# Patient Record
Sex: Female | Born: 1980 | Race: Black or African American | Hispanic: No | Marital: Married | Smoking: Never smoker
Health system: Southern US, Community
[De-identification: ages and names within clinical notes are randomized; demographics above are authoritative.]

## PROBLEM LIST (undated history)

## (undated) DIAGNOSIS — Z21 Asymptomatic human immunodeficiency virus [HIV] infection status: Secondary | ICD-10-CM

## (undated) DIAGNOSIS — R011 Cardiac murmur, unspecified: Secondary | ICD-10-CM

## (undated) DIAGNOSIS — D649 Anemia, unspecified: Secondary | ICD-10-CM

## (undated) DIAGNOSIS — H5440 Blindness, one eye, unspecified eye: Secondary | ICD-10-CM

## (undated) DIAGNOSIS — I509 Heart failure, unspecified: Secondary | ICD-10-CM

## (undated) DIAGNOSIS — B20 Human immunodeficiency virus [HIV] disease: Secondary | ICD-10-CM

## (undated) HISTORY — DX: Asymptomatic human immunodeficiency virus (hiv) infection status: Z21

## (undated) HISTORY — DX: Human immunodeficiency virus (HIV) disease: B20

## (undated) HISTORY — DX: Blindness, one eye, unspecified eye: H54.40

---

## 2015-07-16 ENCOUNTER — Other Ambulatory Visit: Payer: Self-pay

## 2015-08-03 ENCOUNTER — Ambulatory Visit: Payer: Self-pay | Admitting: Infectious Diseases

## 2015-09-21 ENCOUNTER — Other Ambulatory Visit (INDEPENDENT_AMBULATORY_CARE_PROVIDER_SITE_OTHER): Payer: Medicaid Other

## 2015-09-21 DIAGNOSIS — B20 Human immunodeficiency virus [HIV] disease: Secondary | ICD-10-CM | POA: Diagnosis not present

## 2015-09-21 DIAGNOSIS — Z79899 Other long term (current) drug therapy: Secondary | ICD-10-CM

## 2015-09-21 DIAGNOSIS — Z113 Encounter for screening for infections with a predominantly sexual mode of transmission: Secondary | ICD-10-CM

## 2015-09-21 DIAGNOSIS — Z111 Encounter for screening for respiratory tuberculosis: Secondary | ICD-10-CM

## 2015-09-21 LAB — COMPLETE METABOLIC PANEL WITH GFR
ALK PHOS: 73 U/L (ref 33–115)
ALT: 3 U/L — ABNORMAL LOW (ref 6–29)
AST: 10 U/L (ref 10–30)
Albumin: 3.8 g/dL (ref 3.6–5.1)
BUN: 6 mg/dL — AB (ref 7–25)
CHLORIDE: 108 mmol/L (ref 98–110)
CO2: 25 mmol/L (ref 20–31)
Calcium: 8.7 mg/dL (ref 8.6–10.2)
Creat: 0.57 mg/dL (ref 0.50–1.10)
GFR, Est African American: >89 mL/min (ref >=60)
GLUCOSE: 85 mg/dL (ref 65–99)
POTASSIUM: 3.6 mmol/L (ref 3.5–5.3)
SODIUM: 139 mmol/L (ref 135–146)
Total Bilirubin: 2.7 mg/dL — ABNORMAL HIGH (ref 0.2–1.2)
Total Protein: 6.7 g/dL (ref 6.1–8.1)

## 2015-09-21 LAB — CBC WITH DIFFERENTIAL/PLATELET
BASOS ABS: 0 {cells}/uL (ref 0–200)
Basophils Relative: 0 %
EOS PCT: 3 %
Eosinophils Absolute: 135 cells/uL (ref 15–500)
HCT: 19.2 % — ABNORMAL LOW (ref 35.0–45.0)
Hemoglobin: 5.8 g/dL — CL (ref 11.7–15.5)
LYMPHS ABS: 2160 {cells}/uL (ref 850–3900)
Lymphocytes Relative: 48 %
MCH: 19.1 pg — AB (ref 27.0–33.0)
MCHC: 30.2 g/dL — AB (ref 32.0–36.0)
MCV: 63.4 fL — ABNORMAL LOW (ref 80.0–100.0)
MONOS PCT: 7 %
MPV: 9 fL (ref 7.5–12.5)
Monocytes Absolute: 315 cells/uL (ref 200–950)
Neutro Abs: 1890 cells/uL (ref 1500–7800)
Neutrophils Relative %: 42 %
PLATELETS: 582 10*3/uL — AB (ref 140–400)
RBC: 3.03 MIL/uL — AB (ref 3.80–5.10)
RDW: 22 % — ABNORMAL HIGH (ref 11.0–15.0)
WBC: 4.5 10*3/uL (ref 3.8–10.8)

## 2015-09-21 LAB — URINALYSIS
BILIRUBIN URINE: NEGATIVE
GLUCOSE, UA: NEGATIVE
Hgb urine dipstick: NEGATIVE
Ketones, ur: NEGATIVE
Leukocytes, UA: NEGATIVE
Nitrite: POSITIVE — AB
Protein, ur: NEGATIVE
SPECIFIC GRAVITY, URINE: 1.013 (ref 1.001–1.035)
pH: 6 (ref 5.0–8.0)

## 2015-09-21 LAB — LIPID PANEL
CHOL/HDL RATIO: 3.7 ratio (ref <=5.0)
Cholesterol: 151 mg/dL (ref 125–200)
HDL: 41 mg/dL — AB (ref >=46)
LDL CALC: 94 mg/dL (ref <130)
Triglycerides: 78 mg/dL (ref <150)
VLDL: 16 mg/dL (ref <30)

## 2015-09-21 NOTE — Addendum Note (Signed)
Addended by: Mariea Clonts D on: 09/21/2015 11:07 AM   Modules accepted: Orders

## 2015-09-22 ENCOUNTER — Telehealth: Payer: Self-pay | Admitting: *Deleted

## 2015-09-22 LAB — HEPATITIS B CORE ANTIBODY, TOTAL: HEP B C TOTAL AB: REACTIVE — AB

## 2015-09-22 LAB — HEPATITIS C ANTIBODY: HCV AB: NEGATIVE

## 2015-09-22 LAB — QUANTIFERON TB GOLD ASSAY (BLOOD)
Mitogen-Nil: 0.12 IU/mL
Quantiferon Nil Value: 0.02 IU/mL
Quantiferon Tb Ag Minus Nil Value: 0 IU/mL

## 2015-09-22 LAB — HEPATITIS B SURFACE ANTIBODY,QUALITATIVE: HEP B S AB: POSITIVE — AB

## 2015-09-22 LAB — URINE CYTOLOGY ANCILLARY ONLY
Chlamydia: NEGATIVE
Neisseria Gonorrhea: NEGATIVE

## 2015-09-22 LAB — RPR

## 2015-09-22 LAB — HEPATITIS A ANTIBODY, TOTAL: Hep A Total Ab: REACTIVE — AB

## 2015-09-22 LAB — HEPATITIS B SURFACE ANTIGEN: Hepatitis B Surface Ag: NEGATIVE

## 2015-09-22 NOTE — Telephone Encounter (Signed)
Dr. Orvan Falconer notified of low hemoglobin of 5.8 and I called to check on patient. No answer and no voice mail on her or her husbands phone. Called referral source, [facility] in [place] and spoke to Lindi Adie, Charity fundraiser. She is familiar with this patient and stated she has a history of iron deficiency and has had previous infusions in [place]. Patient is currently staying in a hotel in [place] and has follow up with Dr. Ninetta Lights on 10/02/15. Wendall Mola CMA

## 2015-09-23 LAB — HIV-1 RNA ULTRAQUANT REFLEX TO GENTYP+: HIV 1 RNA Quant: <20 copies/mL (ref <20)

## 2015-09-23 LAB — T-HELPER CELLS (CD4) COUNT (NOT AT ARMC)
CD4 % Helper T Cell: 32 % — ABNORMAL LOW (ref 33–55)
CD4 T Cell Abs: 710 /uL (ref 400–2700)

## 2015-09-29 ENCOUNTER — Other Ambulatory Visit: Payer: Self-pay | Admitting: Infectious Diseases

## 2015-09-29 DIAGNOSIS — D649 Anemia, unspecified: Secondary | ICD-10-CM

## 2015-09-30 ENCOUNTER — Other Ambulatory Visit: Payer: Medicaid Other

## 2015-10-02 ENCOUNTER — Telehealth: Payer: Self-pay | Admitting: *Deleted

## 2015-10-02 ENCOUNTER — Telehealth: Payer: Self-pay

## 2015-10-02 ENCOUNTER — Other Ambulatory Visit: Payer: Self-pay | Admitting: Hematology & Oncology

## 2015-10-02 ENCOUNTER — Encounter: Payer: Self-pay | Admitting: Infectious Diseases

## 2015-10-02 ENCOUNTER — Ambulatory Visit (INDEPENDENT_AMBULATORY_CARE_PROVIDER_SITE_OTHER): Payer: Medicaid Other | Admitting: Infectious Diseases

## 2015-10-02 VITALS — BP 121/71 | HR 81 | Temp 98.0°F | Ht 62.0 in | Wt 172.0 lb

## 2015-10-02 DIAGNOSIS — Z23 Encounter for immunization: Secondary | ICD-10-CM

## 2015-10-02 DIAGNOSIS — D509 Iron deficiency anemia, unspecified: Secondary | ICD-10-CM

## 2015-10-02 DIAGNOSIS — D573 Sickle-cell trait: Secondary | ICD-10-CM

## 2015-10-02 DIAGNOSIS — B20 Human immunodeficiency virus [HIV] disease: Secondary | ICD-10-CM | POA: Diagnosis not present

## 2015-10-02 DIAGNOSIS — R011 Cardiac murmur, unspecified: Secondary | ICD-10-CM | POA: Diagnosis not present

## 2015-10-02 LAB — CBC
HCT: 20.3 % — ABNORMAL LOW (ref 36.0–46.0)
Hemoglobin: 5.8 g/dL — CL (ref 12.0–15.0)
MCH: 19.1 pg — ABNORMAL LOW (ref 26.0–34.0)
MCHC: 28.6 g/dL — ABNORMAL LOW (ref 30.0–36.0)
MCV: 66.8 fL — AB (ref 78.0–100.0)
PLATELETS: 333 10*3/uL (ref 150–400)
RBC: 3.04 MIL/uL — ABNORMAL LOW (ref 3.87–5.11)
RDW: 20.3 % — AB (ref 11.5–15.5)
WBC: 6.1 10*3/uL (ref 4.0–10.5)

## 2015-10-02 MED ORDER — ELVITEG-COBIC-EMTRICIT-TENOFAF 150-150-200-10 MG PO TABS: 1.0000 | ORAL_TABLET | Freq: Every day | ORAL | 3 refills | Status: AC

## 2015-10-02 NOTE — Assessment & Plan Note (Addendum)
Will have her seen by Heme Will recheck her HgB, she is severely anemic, although asx. Normal BP and normal HR

## 2015-10-02 NOTE — Assessment & Plan Note (Signed)
She will be plugged into CM Will change her ART to qday genvoya Will see her back in  3 months.  F/u her shot record. Will need flu and pnvx She is Hep B and A immune.

## 2015-10-02 NOTE — Addendum Note (Signed)
Addended by: Wendall MolaOCKERHAM, JACQUELINE A on: 10/02/2015 11:43 AM   Modules accepted: Orders

## 2015-10-02 NOTE — Telephone Encounter (Signed)
Critical lab value call received - hemoglobin 5.8 Dr. Ninetta LightsHatcher notified. Per Dr Ninetta LightsHatcher, this is stable. Andree CossHowell, Cal Gindlesperger M, RN

## 2015-10-02 NOTE — Assessment & Plan Note (Signed)
Will check TTE

## 2015-10-02 NOTE — Addendum Note (Signed)
Addended by: Mariea ClontsGREEN, Ziasia Lenoir D on: 10/02/2015 11:27 AM   Modules accepted: Orders

## 2015-10-02 NOTE — Telephone Encounter (Signed)
Patient called back to clarify the message left on voicemail by Annice PihJackie, CMA. Called Annice PihJackie to clarify. Explained to patient ECHO is scheduled 10/08/15 at 1:00pm and she is to [facility] at 12:45pm [facility]. Patient verbalized directions and understanding. Rejeana Brockandace Murray, LPN

## 2015-10-02 NOTE — Telephone Encounter (Signed)
I spoke with heme They will see her next week.

## 2015-10-02 NOTE — Progress Notes (Signed)
   Subjective:    Patient ID: Robyn Contreras, female    DOB: Sep 24, 1980, [age] y.o.   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 States she gets regular iron infusions.  Was dx when she developed an illness.   Currently taking Atazanavir, norvir, truvada.  No prev HIV related hospitalizations.  She has 2 HIV- daughters.   HIV 1 RNA Quant (copies/mL)  Date Value  09/21/2015 <20   CD4 T Cell Abs (/uL)  Date Value  09/21/2015 710    The past medical history, family history and social history were reviewed/updated in EPIC  Review of Systems  Constitutional: Negative for appetite change and unexpected weight change.  Respiratory: Negative for cough and shortness of breath.   Gastrointestinal: Negative for constipation and diarrhea.  Genitourinary: Negative for difficulty urinating.  last PAP was "couple of months ago" in [place].      Objective:   Physical Exam  Constitutional: She appears well-developed and well-nourished.  HENT:  Mouth/Throat: No oropharyngeal exudate.  Neck: Neck supple.  Cardiovascular: Normal rate and regular rhythm.   Murmur heard. Pulmonary/Chest: Effort normal and breath sounds normal.  Abdominal: Soft. Bowel sounds are normal. There is no tenderness. There is no rebound.  Musculoskeletal: She exhibits no edema.  Lymphadenopathy:    She has no cervical adenopathy.  L eye occluded.       Assessment & Plan:

## 2015-10-05 ENCOUNTER — Ambulatory Visit: Payer: Self-pay | Admitting: Infectious Diseases

## 2015-10-05 ENCOUNTER — Encounter: Payer: Self-pay | Admitting: Hematology and Oncology

## 2015-10-05 ENCOUNTER — Telehealth: Payer: Self-pay | Admitting: Hematology and Oncology

## 2015-10-05 ENCOUNTER — Telehealth: Payer: Self-pay | Admitting: *Deleted

## 2015-10-05 NOTE — Telephone Encounter (Signed)
Called to inform patient that hematology would be contacting her to arrange the new patient appointment. Robyn Contreras

## 2015-10-05 NOTE — Telephone Encounter (Signed)
Appointment scheduled with Gudena on 8/29 at 1pm. Patient agreed. Letter mailed to the patient. Demographics verified.

## 2015-10-08 ENCOUNTER — Ambulatory Visit (HOSPITAL_COMMUNITY)
Admission: RE | Admit: 2015-10-08 | Discharge: 2015-10-08 | Disposition: A | Discharge status: Home / Self Care | Payer: Medicaid Other | Source: Ambulatory Visit | Attending: Infectious Diseases | Admitting: Infectious Diseases

## 2015-10-08 DIAGNOSIS — R011 Cardiac murmur, unspecified: Secondary | ICD-10-CM | POA: Diagnosis present

## 2015-10-08 DIAGNOSIS — I340 Nonrheumatic mitral (valve) insufficiency: Secondary | ICD-10-CM | POA: Insufficient documentation

## 2015-10-08 DIAGNOSIS — I517 Cardiomegaly: Secondary | ICD-10-CM | POA: Diagnosis not present

## 2015-10-08 DIAGNOSIS — I351 Nonrheumatic aortic (valve) insufficiency: Secondary | ICD-10-CM | POA: Insufficient documentation

## 2015-10-08 LAB — ECHOCARDIOGRAM COMPLETE BUBBLE STUDY
CHL CUP RV SYS PRESS: 29 mmHg
CHL CUP TV REG PEAK VELOCITY: 253 cm/s
E decel time: 215 msec
EERAT: 7.96
FS: 28 % (ref 28–44)
IVS/LV PW RATIO, ED: 0.77
LA diam end sys: 40 mm
LA vol A4C: 53.3 ml
LADIAMINDEX: 2.13 cm/m2
LASIZE: 40 mm
LAVOL: 64 mL
LAVOLIN: 34.1 mL/m2
LV E/e' medial: 7.96
LV TDI E'MEDIAL: 9.57
LV e' LATERAL: 11.7 cm/s
LVEEAVG: 7.96
LVOT area: 3.14 cm2
LVOTD: 20 mm
Lateral S' vel: 13.7 cm/s
MV Dec: 215
MV Peak grad: 3 mmHg
MVPKAVEL: 74.2 m/s
MVPKEVEL: 93.1 m/s
PW: 9.84 mm — AB (ref 0.6–1.1)
RV TAPSE: 23.9 mm
TDI e' lateral: 11.7
TRMAXVEL: 253 cm/s

## 2015-10-08 NOTE — Progress Notes (Signed)
Agitated saline bubble study x 2 administered during echo exam. Patient tolerated well.

## 2015-10-08 NOTE — Progress Notes (Signed)
  Echocardiogram 2D Echocardiogram has been performed.  Robyn Contreras, Robyn Contreras 10/08/2015, 2:42 PM

## 2015-10-12 ENCOUNTER — Telehealth: Payer: Self-pay

## 2015-10-12 NOTE — Telephone Encounter (Signed)
Patient called to get results of her ECHO. Explained to patient that LPN will send a note Dr. Ninetta LightsHatcher making him aware of her call. Once a response is given then she will get a return phone call. Patient verbalized understanding. Rejeana Brockandace Dazani Norby, LPN

## 2015-10-14 ENCOUNTER — Telehealth: Payer: Self-pay | Admitting: Infectious Diseases

## 2015-10-14 NOTE — Telephone Encounter (Signed)
Her heart function was normal on her echo. She has mild MR and AR Called and left message on her phone that her echo showed her heart function to be "essentially normal"

## 2015-10-20 ENCOUNTER — Telehealth: Payer: Self-pay | Admitting: Hematology and Oncology

## 2015-10-20 ENCOUNTER — Ambulatory Visit (HOSPITAL_BASED_OUTPATIENT_CLINIC_OR_DEPARTMENT_OTHER): Payer: Medicaid Other | Admitting: Hematology and Oncology

## 2015-10-20 ENCOUNTER — Encounter: Payer: Self-pay | Admitting: Hematology and Oncology

## 2015-10-20 VITALS — BP 124/66 | HR 82 | Temp 98.9°F | Resp 18 | Ht 62.0 in | Wt 176.7 lb

## 2015-10-20 DIAGNOSIS — D573 Sickle-cell trait: Secondary | ICD-10-CM | POA: Diagnosis not present

## 2015-10-20 DIAGNOSIS — D500 Iron deficiency anemia secondary to blood loss (chronic): Secondary | ICD-10-CM | POA: Insufficient documentation

## 2015-10-20 NOTE — Telephone Encounter (Signed)
Spoke with pt to confirm 9/5 infusion appt per 8/29 LOS

## 2015-10-20 NOTE — Assessment & Plan Note (Signed)
Chronic severe iron deficiency anemia secondary to heavy menstrual bleeding as well as recent tubal pregnancy related heavy bleeding. Patient has known sickle cell trait. Based on her prior history, patient can only tolerate IVs iron sucrose. We will order iron sucrose 200 mg doses 5 days. I will consult gastroenterology for upper endoscopy and colonoscopy.  Return to clinic in one month recheck on blood work and iron studies.

## 2015-10-20 NOTE — Progress Notes (Signed)
[facility] CONSULT NOTE  No care team member to display  CHIEF COMPLAINTS/PURPOSE OF CONSULTATION:  Severe iron deficiency anemia  HISTORY OF PRESENTING ILLNESS:  Robyn Contreras 35 y.o. female is here because of recent diagnosis of severe iron deficiency anemia. Patient has a known diagnosis of anemia all her life. She had previously been intolerant to oral iron therapy. She was in [place] where she had received IV iron treatments previously. Apparently she could only tolerate IV iron sucrose. She could not tolerate some other forms of iron. She gets episodes of shaking and dystonia related to periods of heavy menstrual bleeding most likely related to perform iron deficiency. Patient is short of breath to minimal exertion and does not seem to have much energy to do any activities. She had a daughter 2 years ago and accompanying that she had heavy menstrual bleeding. Currently her cycles are somewhat irregular and are generally very heavy as well.  I reviewed her records extensively and collaborated the history with the patient.  MEDICAL HISTORY:  Past Medical History:  Diagnosis Date  . Blindness of left eye   . HIV (human immunodeficiency virus infection) (HCC)     SURGICAL HISTORY: No past surgical history on file.  SOCIAL HISTORY: Social History   Social History  . Marital status: Married    Spouse name: N/A  . Number of children: N/A  . Years of education: N/A   Occupational History  . Not on file.   Social History Main Topics  . Smoking status: Never Smoker  . Smokeless tobacco: Never Used  . Alcohol use No  . Drug use: No  . Sexual activity: Yes    Partners: Male   Other Topics Concern  . Not on file   Social History Narrative  . No narrative on file    FAMILY HISTORY: Family History  Problem Relation Age of Onset  . Aneurysm Father     ALLERGIES:  is allergic to vancomycin.  MEDICATIONS:  Current Outpatient Prescriptions  Medication  Sig Dispense Refill  . elvitegravir-cobicistat-emtricitabine-tenofovir (GENVOYA) 150-150-200-10 MG TABS tablet Take 1 tablet by mouth daily with breakfast. 90 tablet 3   No current facility-administered medications for this visit.     REVIEW OF SYSTEMS:   Constitutional: Denies fevers, chills or abnormal night sweats Eyes: Denies blurriness of vision, double vision or watery eyes Ears, nose, mouth, throat, and face: Denies mucositis or sore throat Respiratory: Denies cough, dyspnea or wheezes Cardiovascular: Denies palpitation, chest discomfort or lower extremity swelling Gastrointestinal:  Denies nausea, heartburn or change in bowel habits Skin: Denies abnormal skin rashes Lymphatics: Denies new lymphadenopathy or easy bruising Neurological:Denies numbness, tingling or new weaknesses Behavioral/Psych: Mood is stable, no new changes   All other systems were reviewed with the patient and are negative.  PHYSICAL EXAMINATION: ECOG PERFORMANCE STATUS: 1 - Symptomatic but completely ambulatory  Vitals:   10/20/15 1355  BP: 124/66  Pulse: 82  Resp: 18  Temp: 98.9 F (37.2 C)   Filed Weights   10/20/15 1355  Weight: 176 lb 11.2 oz (80.2 kg)    GENERAL:alert, no distress and comfortable SKIN: skin color, texture, turgor are normal, no rashes or significant lesions EYES: normal, conjunctiva are pink and non-injected, sclera clear OROPHARYNX:no exudate, no erythema and lips, buccal mucosa, and tongue normal  NECK: supple, thyroid normal size, non-tender, without nodularity LYMPH:  no palpable lymphadenopathy in the cervical, axillary or inguinal LUNGS: clear to auscultation and percussion with normal breathing effort HEART:  Ejection systolic murmur ABDOMEN:abdomen soft, non-tender and normal bowel sounds Musculoskeletal:no cyanosis of digits and no clubbing  PSYCH: alert & oriented x 3 with fluent speech NEURO: no focal motor/sensory deficits  LABORATORY DATA:  I have reviewed  the data as listed Lab Results  Component Value Date   WBC 6.1 10/02/2015   HGB 5.8 (LL) 10/02/2015   HCT 20.3 (L) 10/02/2015   MCV 66.8 (L) 10/02/2015   PLT 333 10/02/2015   Lab Results  Component Value Date   NA 139 09/21/2015   K 3.6 09/21/2015   CL 108 09/21/2015   CO2 25 09/21/2015   ASSESSMENT AND PLAN:  Iron deficiency anemia due to chronic blood loss Chronic severe iron deficiency anemia secondary to heavy menstrual bleeding as well as recent tubal pregnancy related heavy bleeding. Patient has known sickle cell trait. Based on her prior history, patient can only tolerate IVs iron sucrose. We will order iron sucrose 200 mg doses 5 days. I will consult gastroenterology for upper endoscopy and colonoscopy.  Return to clinic in one month recheck on blood work and iron studies.   All questions were answered. The patient knows to call the clinic with any problems, questions or concerns.    Sabas Sous, MD 10/20/15

## 2015-10-20 NOTE — Telephone Encounter (Signed)
appt made and avs printed °

## 2015-10-27 ENCOUNTER — Other Ambulatory Visit: Payer: Self-pay | Admitting: Hematology and Oncology

## 2015-10-27 ENCOUNTER — Ambulatory Visit (HOSPITAL_BASED_OUTPATIENT_CLINIC_OR_DEPARTMENT_OTHER): Payer: Medicaid Other

## 2015-10-27 ENCOUNTER — Encounter: Payer: Self-pay | Admitting: Licensed Clinical Social Worker

## 2015-10-27 VITALS — BP 108/77 | HR 80 | Temp 98.1°F | Resp 16

## 2015-10-27 DIAGNOSIS — D500 Iron deficiency anemia secondary to blood loss (chronic): Secondary | ICD-10-CM

## 2015-10-27 DIAGNOSIS — N921 Excessive and frequent menstruation with irregular cycle: Secondary | ICD-10-CM

## 2015-10-27 MED ORDER — IRON SUCROSE 20 MG/ML IV SOLN
200.0000 mg | Freq: Once | INTRAVENOUS | Status: AC
  Administered 2015-10-27: 200 mg via INTRAVENOUS
  Filled 2015-10-27: qty 5

## 2015-10-27 MED ORDER — SODIUM CHLORIDE 0.9 % IV SOLN
Freq: Once | INTRAVENOUS | Status: AC
  Administered 2015-10-27: 08:00:00 via INTRAVENOUS

## 2015-10-27 NOTE — Patient Instructions (Signed)
Iron Sucrose injection  What is this medicine?  IRON SUCROSE (AHY ern SOO krohs) is an iron complex. Iron is used to make healthy red blood cells, which carry oxygen and nutrients throughout the body. This medicine is used to treat iron deficiency anemia in people with chronic kidney disease.  This medicine may be used for other purposes; ask your health care provider or pharmacist if you have questions.  What should I tell my health care provider before I take this medicine?  They need to know if you have any of these conditions:  -anemia not caused by low iron levels  -heart disease  -high levels of iron in the blood  -kidney disease  -liver disease  -an unusual or allergic reaction to iron, other medicines, foods, dyes, or preservatives  -pregnant or trying to get pregnant  -breast-feeding  How should I use this medicine?  This medicine is for infusion into a vein. It is given by a health care professional in a hospital or clinic setting.  Talk to your pediatrician regarding the use of this medicine in children. While this drug may be prescribed for children as young as 2 years for selected conditions, precautions do apply.  Overdosage: If you think you have taken too much of this medicine contact a poison control center or emergency room at once.  NOTE: This medicine is only for you. Do not share this medicine with others.  What if I miss a dose?  It is important not to miss your dose. Call your doctor or health care professional if you are unable to keep an appointment.  What may interact with this medicine?  Do not take this medicine with any of the following medications:  -deferoxamine  -dimercaprol  -other iron products  This medicine may also interact with the following medications:  -chloramphenicol  -deferasirox  This list may not describe all possible interactions. Give your health care provider a list of all the medicines, herbs, non-prescription drugs, or dietary supplements you use. Also tell them if  you smoke, drink alcohol, or use illegal drugs. Some items may interact with your medicine.  What should I watch for while using this medicine?  Visit your doctor or healthcare professional regularly. Tell your doctor or healthcare professional if your symptoms do not start to get better or if they get worse. You may need blood work done while you are taking this medicine.  You may need to follow a special diet. Talk to your doctor. Foods that contain iron include: whole grains/cereals, dried fruits, beans, or peas, leafy green vegetables, and organ meats (liver, kidney).  What side effects may I notice from receiving this medicine?  Side effects that you should report to your doctor or health care professional as soon as possible:  -allergic reactions like skin rash, itching or hives, swelling of the face, lips, or tongue  -breathing problems  -changes in blood pressure  -cough  -fast, irregular heartbeat  -feeling faint or lightheaded, falls  -fever or chills  -flushing, sweating, or hot feelings  -joint or muscle aches/pains  -seizures  -swelling of the ankles or feet  -unusually weak or tired  Side effects that usually do not require medical attention (report to your doctor or health care professional if they continue or are bothersome):  -diarrhea  -feeling achy  -headache  -irritation at site where injected  -nausea, vomiting  -stomach upset  -tiredness  This list may not describe all possible side effects. Call your doctor   for medical advice about side effects. You may report side effects to FDA at 1-800-FDA-1088.  Where should I keep my medicine?  This drug is given in a hospital or clinic and will not be stored at home.  NOTE: This sheet is a summary. It may not cover all possible information. If you have questions about this medicine, talk to your doctor, pharmacist, or health care provider.      2016, Elsevier/Gold Standard. (2010-11-18 17:14:35)

## 2015-10-28 ENCOUNTER — Ambulatory Visit: Payer: Medicaid Other

## 2015-10-29 ENCOUNTER — Encounter: Payer: Self-pay | Admitting: Hematology and Oncology

## 2015-10-29 ENCOUNTER — Ambulatory Visit (HOSPITAL_BASED_OUTPATIENT_CLINIC_OR_DEPARTMENT_OTHER): Payer: Medicaid Other

## 2015-10-29 VITALS — BP 109/79 | HR 59 | Temp 98.3°F | Resp 18

## 2015-10-29 DIAGNOSIS — D500 Iron deficiency anemia secondary to blood loss (chronic): Secondary | ICD-10-CM

## 2015-10-29 DIAGNOSIS — N920 Excessive and frequent menstruation with regular cycle: Secondary | ICD-10-CM

## 2015-10-29 MED ORDER — SODIUM CHLORIDE 0.9 % IV SOLN
200.0000 mg | Freq: Once | INTRAVENOUS | Status: AC
  Administered 2015-10-29: 200 mg via INTRAVENOUS
  Filled 2015-10-29: qty 5

## 2015-10-29 MED ORDER — SODIUM CHLORIDE 0.9 % IV SOLN
Freq: Once | INTRAVENOUS | Status: AC
  Administered 2015-10-29: 15:00:00 via INTRAVENOUS

## 2015-10-29 NOTE — Patient Instructions (Signed)
Iron Sucrose injection  What is this medicine?  IRON SUCROSE (AHY ern SOO krohs) is an iron complex. Iron is used to make healthy red blood cells, which carry oxygen and nutrients throughout the body. This medicine is used to treat iron deficiency anemia in people with chronic kidney disease.  This medicine may be used for other purposes; ask your health care provider or pharmacist if you have questions.  What should I tell my health care provider before I take this medicine?  They need to know if you have any of these conditions:  -anemia not caused by low iron levels  -heart disease  -high levels of iron in the blood  -kidney disease  -liver disease  -an unusual or allergic reaction to iron, other medicines, foods, dyes, or preservatives  -pregnant or trying to get pregnant  -breast-feeding  How should I use this medicine?  This medicine is for infusion into a vein. It is given by a health care professional in a hospital or clinic setting.  Talk to your pediatrician regarding the use of this medicine in children. While this drug may be prescribed for children as young as 2 years for selected conditions, precautions do apply.  Overdosage: If you think you have taken too much of this medicine contact a poison control center or emergency room at once.  NOTE: This medicine is only for you. Do not share this medicine with others.  What if I miss a dose?  It is important not to miss your dose. Call your doctor or health care professional if you are unable to keep an appointment.  What may interact with this medicine?  Do not take this medicine with any of the following medications:  -deferoxamine  -dimercaprol  -other iron products  This medicine may also interact with the following medications:  -chloramphenicol  -deferasirox  This list may not describe all possible interactions. Give your health care provider a list of all the medicines, herbs, non-prescription drugs, or dietary supplements you use. Also tell them if  you smoke, drink alcohol, or use illegal drugs. Some items may interact with your medicine.  What should I watch for while using this medicine?  Visit your doctor or healthcare professional regularly. Tell your doctor or healthcare professional if your symptoms do not start to get better or if they get worse. You may need blood work done while you are taking this medicine.  You may need to follow a special diet. Talk to your doctor. Foods that contain iron include: whole grains/cereals, dried fruits, beans, or peas, leafy green vegetables, and organ meats (liver, kidney).  What side effects may I notice from receiving this medicine?  Side effects that you should report to your doctor or health care professional as soon as possible:  -allergic reactions like skin rash, itching or hives, swelling of the face, lips, or tongue  -breathing problems  -changes in blood pressure  -cough  -fast, irregular heartbeat  -feeling faint or lightheaded, falls  -fever or chills  -flushing, sweating, or hot feelings  -joint or muscle aches/pains  -seizures  -swelling of the ankles or feet  -unusually weak or tired  Side effects that usually do not require medical attention (report to your doctor or health care professional if they continue or are bothersome):  -diarrhea  -feeling achy  -headache  -irritation at site where injected  -nausea, vomiting  -stomach upset  -tiredness  This list may not describe all possible side effects. Call your doctor   for medical advice about side effects. You may report side effects to FDA at 1-800-FDA-1088.  Where should I keep my medicine?  This drug is given in a hospital or clinic and will not be stored at home.  NOTE: This sheet is a summary. It may not cover all possible information. If you have questions about this medicine, talk to your doctor, pharmacist, or health care provider.      2016, Elsevier/Gold Standard. (2010-11-18 17:14:35)

## 2015-10-30 ENCOUNTER — Ambulatory Visit (HOSPITAL_BASED_OUTPATIENT_CLINIC_OR_DEPARTMENT_OTHER): Payer: Medicaid Other

## 2015-10-30 VITALS — BP 117/78 | HR 68 | Temp 99.1°F | Resp 16

## 2015-10-30 DIAGNOSIS — D500 Iron deficiency anemia secondary to blood loss (chronic): Secondary | ICD-10-CM

## 2015-10-30 DIAGNOSIS — N920 Excessive and frequent menstruation with regular cycle: Secondary | ICD-10-CM | POA: Diagnosis not present

## 2015-10-30 MED ORDER — SODIUM CHLORIDE 0.9 % IV SOLN
200.0000 mg | Freq: Once | INTRAVENOUS | Status: AC
  Administered 2015-10-30: 200 mg via INTRAVENOUS
  Filled 2015-10-30: qty 10

## 2015-10-30 NOTE — Patient Instructions (Signed)
Iron Sucrose injection  What is this medicine?  IRON SUCROSE (AHY ern SOO krohs) is an iron complex. Iron is used to make healthy red blood cells, which carry oxygen and nutrients throughout the body. This medicine is used to treat iron deficiency anemia in people with chronic kidney disease.  This medicine may be used for other purposes; ask your health care provider or pharmacist if you have questions.  What should I tell my health care provider before I take this medicine?  They need to know if you have any of these conditions:  -anemia not caused by low iron levels  -heart disease  -high levels of iron in the blood  -kidney disease  -liver disease  -an unusual or allergic reaction to iron, other medicines, foods, dyes, or preservatives  -pregnant or trying to get pregnant  -breast-feeding  How should I use this medicine?  This medicine is for infusion into a vein. It is given by a health care professional in a hospital or clinic setting.  Talk to your pediatrician regarding the use of this medicine in children. While this drug may be prescribed for children as Robyn Contreras as 2 years for selected conditions, precautions do apply.  Overdosage: If you think you have taken too much of this medicine contact a poison control center or emergency room at once.  NOTE: This medicine is only for you. Do not share this medicine with others.  What if I miss a dose?  It is important not to miss your dose. Call your doctor or health care professional if you are unable to keep an appointment.  What may interact with this medicine?  Do not take this medicine with any of the following medications:  -deferoxamine  -dimercaprol  -other iron products  This medicine may also interact with the following medications:  -chloramphenicol  -deferasirox  This list may not describe all possible interactions. Give your health care provider a list of all the medicines, herbs, non-prescription drugs, or dietary supplements you use. Also tell them if  you smoke, drink alcohol, or use illegal drugs. Some items may interact with your medicine.  What should I watch for while using this medicine?  Visit your doctor or healthcare professional regularly. Tell your doctor or healthcare professional if your symptoms do not start to get better or if they get worse. You may need blood work done while you are taking this medicine.  You may need to follow a special diet. Talk to your doctor. Foods that contain iron include: whole grains/cereals, dried fruits, beans, or peas, leafy green vegetables, and organ meats (liver, kidney).  What side effects may I notice from receiving this medicine?  Side effects that you should report to your doctor or health care professional as soon as possible:  -allergic reactions like skin rash, itching or hives, swelling of the face, lips, or tongue  -breathing problems  -changes in blood pressure  -cough  -fast, irregular heartbeat  -feeling faint or lightheaded, falls  -fever or chills  -flushing, sweating, or hot feelings  -joint or muscle aches/pains  -seizures  -swelling of the ankles or feet  -unusually weak or tired  Side effects that usually do not require medical attention (report to your doctor or health care professional if they continue or are bothersome):  -diarrhea  -feeling achy  -headache  -irritation at site where injected  -nausea, vomiting  -stomach upset  -tiredness  This list may not describe all possible side effects. Call your doctor   for medical advice about side effects. You may report side effects to FDA at 1-800-FDA-1088.  Where should I keep my medicine?  This drug is given in a hospital or clinic and will not be stored at home.  NOTE: This sheet is a summary. It may not cover all possible information. If you have questions about this medicine, talk to your doctor, pharmacist, or health care provider.      2016, Elsevier/Gold Standard. (2010-11-18 17:14:35)

## 2015-11-02 ENCOUNTER — Telehealth: Payer: Self-pay | Admitting: Hematology and Oncology

## 2015-11-02 ENCOUNTER — Ambulatory Visit: Payer: Medicaid Other

## 2015-11-02 NOTE — Telephone Encounter (Signed)
Spoke with patient's spouse on 10/30/15 re delays/wait time when patient comes in for infusion. Spouse expressed concern/frustration re the wait times and the amount of time for infusion as it is affecting his work schedule when they are here beyond the amount of time they planned for. I apologize for the inconvenience and asked if there was a time of day that would work out better. Per husband he was unsure if patient could make the PM appointment on Monday and would need AM.   Patient was rescheduled for 9/12 in the AM and a message was left for both patient and spouse.  Patient returned call this morning and moved time back to PM due to spouse's work schedule has changed. Patient also wanted to r/s the infusion she missed from 9/6.  Gave patient new appointments for 9/12 @ 3 pm and 9/13 @ 2:45 pm. Rescheduled 9/11 to 9/12 and 9/6 (missed) to 9/13. Worked with inf to schedule appointments.  Both infusion Administrator, sportsmanager and practice administrator have been informed.

## 2015-11-03 ENCOUNTER — Other Ambulatory Visit: Payer: Self-pay | Admitting: Hematology and Oncology

## 2015-11-03 ENCOUNTER — Ambulatory Visit: Payer: Medicaid Other

## 2015-11-04 ENCOUNTER — Ambulatory Visit: Payer: Medicaid Other

## 2015-11-05 ENCOUNTER — Telehealth: Payer: Self-pay | Admitting: *Deleted

## 2015-11-05 NOTE — Telephone Encounter (Signed)
Per staff message I have moved appts from this week to next week. Scheduler notified

## 2015-11-12 ENCOUNTER — Ambulatory Visit: Payer: Medicaid Other

## 2015-11-13 ENCOUNTER — Ambulatory Visit: Payer: Medicaid Other

## 2015-11-20 ENCOUNTER — Other Ambulatory Visit: Payer: Medicaid Other

## 2015-11-20 ENCOUNTER — Ambulatory Visit: Payer: Medicaid Other | Admitting: Hematology and Oncology

## 2015-11-20 NOTE — Assessment & Plan Note (Deleted)
Chronic severe iron deficiency anemia secondary to heavy menstrual bleeding as well as recent tubal pregnancy related heavy bleeding. Patient has known sickle cell trait. Based on her prior history, patient can only tolerate IVs iron sucrose. We consulted gastroenterology for upper endoscopy and colonoscopy.

## 2015-11-26 ENCOUNTER — Encounter (HOSPITAL_COMMUNITY): Payer: Self-pay

## 2015-11-26 ENCOUNTER — Emergency Department (HOSPITAL_COMMUNITY)
Admission: EM | Admit: 2015-11-26 | Discharge: 2015-11-26 | Disposition: A | Discharge status: Home / Self Care | Payer: Medicaid Other | Attending: Emergency Medicine | Admitting: Emergency Medicine

## 2015-11-26 DIAGNOSIS — Z79899 Other long term (current) drug therapy: Secondary | ICD-10-CM | POA: Insufficient documentation

## 2015-11-26 DIAGNOSIS — K047 Periapical abscess without sinus: Secondary | ICD-10-CM | POA: Insufficient documentation

## 2015-11-26 DIAGNOSIS — R220 Localized swelling, mass and lump, head: Secondary | ICD-10-CM | POA: Diagnosis present

## 2015-11-26 MED ORDER — CLINDAMYCIN HCL 300 MG PO CAPS: 300.0000 mg | ORAL_CAPSULE | Freq: Four times a day (QID) | ORAL | 0 refills | Status: DC

## 2015-11-26 NOTE — Discharge Instructions (Signed)
Use heat on the sore area 3-4 times a day.  Rinse the mouth with warm salt water 3-4 times a day.

## 2015-11-26 NOTE — ED Provider Notes (Signed)
[facility]-EMERGENCY DEPT Provider Note   CSN: [id number] Arrival date & time: 11/26/15  1946  By signing my name below, I, Robyn Contreras, attest that this documentation has been prepared under the direction and in the presence of Mancel BaleElliott Flordia Kassem, MD . Electronically Signed: Christy SartoriusAnastasia Contreras, Scribe. 11/26/2015. 9:32 PM.  History   Chief Complaint Chief Complaint  Patient presents with  . Facial Swelling   The history is provided by the patient and medical records. No language interpreter was used.    HPI Comments:  Robyn Contreras is a 35 y.o. female who presents to the Emergency Department complaining of left sided facial swelling and pain.  She denies dental pain.  Pt is currently taking Genvoya.  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   She is not currently working.  No alleviating factors noted.  No additional injury or complaint.    Past Medical History:  Diagnosis Date  . Blindness of left eye   . HIV (human immunodeficiency virus infection) [facility](HCC)     Patient Active Problem List   Diagnosis Date Noted  . Heavy menstrual bleeding 10/29/2015  . Iron deficiency anemia due to chronic blood loss 10/20/2015  . HIV disease (HCC) 10/02/2015  . Sickle cell trait (HCC) 10/02/2015  . Heart murmur 10/02/2015    History reviewed. No pertinent surgical history.  OB History    No data available       Home Medications    Prior to Admission medications   Medication Sig Start Date End Date Taking? Authorizing Provider  clindamycin (CLEOCIN) 300 MG capsule Take 1 capsule (300 mg total) by mouth 4 (four) times daily. X 7 days 11/26/15   Mancel BaleElliott Vinisha Faxon, MD  elvitegravir-cobicistat-emtricitabine-tenofovir (GENVOYA) 150-150-200-10 MG TABS tablet Take 1 tablet by mouth daily with breakfast. 10/02/15   Ginnie SmartJeffrey C Hatcher, MD    Family History Family History  Problem Relation Age of Onset  . Aneurysm Father     Social History Social  History  Substance Use Topics  . Smoking status: Never Smoker  . Smokeless tobacco: Never Used  . Alcohol use No     Allergies   Vancomycin   Review of Systems Review of Systems  Constitutional: Negative for fever.  HENT: Positive for facial swelling. Negative for dental problem.   All other systems reviewed and are negative.    Physical Exam Updated Vital Signs BP 125/87 (BP Location: Left Arm)   Pulse 65   Temp 98.4 F (36.9 C) (Oral)   Resp 19   LMP 11/13/2015   SpO2 99%   Physical Exam  Constitutional: She is oriented to person, place, and time. She appears well-developed and well-nourished. No distress.  HENT:  Head: Normocephalic and atraumatic.  No trismus.  Palpable abscess in the mandibular region.    Eyes: Conjunctivae are normal.  Cardiovascular: Normal rate.   Pulmonary/Chest: Effort normal.  Neurological: She is alert and oriented to person, place, and time.  Skin: Skin is warm and dry.  Psychiatric: She has a normal mood and affect.  Nursing note and vitals reviewed.    ED Treatments / Results   DIAGNOSTIC STUDIES:  Oxygen Saturation is 99% on RA, NML by my interpretation.    COORDINATION OF CARE:  9:30 PM Discussed treatment plan with pt at bedside and pt agreed to plan.  Labs (all labs ordered are listed, but only abnormal results are displayed) Labs Reviewed - No data to display  EKG  EKG Interpretation None       Radiology No results found.  Procedures Procedures (including critical care time)  Medications Ordered in ED Medications - No data to display   Initial Impression / Assessment and Plan / ED Course  I have reviewed the triage vital signs and the nursing notes.  Pertinent labs & imaging results that were available during my care of the patient were reviewed by me and considered in my medical decision making (see chart for details).  Clinical Course    Medications - No data to display  No data found.   At  discharge- Reevaluation with update and discussion. After initial assessment and treatment, an updated evaluation reveals no change in clinical status, findings discussed with patient and all questions answered. Robyn Contreras L    Final Clinical Impressions(s) / ED Diagnoses   Final diagnoses:  Dental abscess   Dental abscess, without trismus or suspected deep tissue infection or airway compromise. No indication for urgent drainage at this time.  Nursing Notes Reviewed/ Care Coordinated Applicable Imaging Reviewed Interpretation of Laboratory Data incorporated into ED treatment  The patient appears reasonably screened and/or stabilized for discharge and I doubt any other medical condition or other [facility] requiring further screening, evaluation, or treatment in the ED at this time prior to discharge.  Plan: Home Medications- continue; Home Treatments- rest, heat; return here if the recommended treatment, does not improve the symptoms; Recommended follow up- PCP prn. Dentist asap.   New Prescriptions New Prescriptions   CLINDAMYCIN (CLEOCIN) 300 MG CAPSULE    Take 1 capsule (300 mg total) by mouth 4 (four) times daily. X 7 days   I personally performed the services described in this documentation, which was scribed in my presence. The recorded information has been reviewed and is accurate.       Mancel Bale, MD 11/30/15 1416

## 2015-11-26 NOTE — ED Notes (Signed)
Patient is alert and oriented x3.  She was given DC instructions and follow up visit instructions.  Patient gave verbal understanding. She was DC ambulatory under her own power to home.  V/S stable.  He was not showing any signs of distress on DC 

## 2015-11-26 NOTE — Progress Notes (Signed)
Patient listed as having Medicaid insurance without a pcp.  Pcp listed on patient's insurance card is located at [facility].

## 2015-11-26 NOTE — ED Triage Notes (Signed)
Pt complains of facial swelling since last night and worse this am on the left side, she's had an abcess on that side before

## 2015-12-16 ENCOUNTER — Telehealth: Payer: Self-pay | Admitting: Hematology and Oncology

## 2015-12-16 NOTE — Telephone Encounter (Signed)
Returned call to patient to reschedule 9/29 Appointments. Rescheduled to 10/31. Call completed.

## 2015-12-17 ENCOUNTER — Ambulatory Visit: Payer: Medicaid Other

## 2015-12-22 ENCOUNTER — Other Ambulatory Visit: Payer: Medicaid Other

## 2015-12-22 ENCOUNTER — Ambulatory Visit: Payer: Medicaid Other | Admitting: Hematology and Oncology

## 2015-12-22 NOTE — Assessment & Plan Note (Deleted)
Chronic severe iron deficiency anemia secondary to heavy menstrual bleeding as well as recent tubal pregnancy related heavy bleeding. Patient has known sickle cell trait. Based on her prior history, patient can only tolerate IVs iron sucrose.  Treatment summary: IV iron sucrose given on 10/27/15, 10/29/15 and 10/30/15 patient did not come for the remaining 2 appointments for IV iron  Gastroenterology has been consulted for upper endoscopy and colonoscopy. Blood work done today revealed  Return to clinic in 3 months for lap count check and follow-up.

## 2016-01-12 ENCOUNTER — Ambulatory Visit: Payer: Medicaid Other

## 2016-01-21 ENCOUNTER — Telehealth: Payer: Self-pay

## 2016-01-21 NOTE — Telephone Encounter (Signed)
Called pt back regarding vm left about rescheduling her appt. For lab dr and iron infusions. Pt states that she had a lot going on in her life and was not able to show up for prior appts. Told pt that will send a scheduling msg to have her labs done next week and will need to come back for md and infusion another day next week. Pt voiced understanding and will look forward to hearing from scheduling for upcoming appt for next week.

## 2016-01-22 ENCOUNTER — Telehealth: Payer: Self-pay | Admitting: Hematology and Oncology

## 2016-01-22 NOTE — Telephone Encounter (Signed)
sw pt to confirm 12/4 and 12/6 appt dates/timesper LOS

## 2016-01-25 ENCOUNTER — Other Ambulatory Visit (HOSPITAL_BASED_OUTPATIENT_CLINIC_OR_DEPARTMENT_OTHER): Payer: Medicaid Other

## 2016-01-25 DIAGNOSIS — D500 Iron deficiency anemia secondary to blood loss (chronic): Secondary | ICD-10-CM

## 2016-01-25 LAB — CBC WITH DIFFERENTIAL/PLATELET
BASO%: 0.6 % (ref 0.0–2.0)
BASOS ABS: 0 10*3/uL (ref 0.0–0.1)
EOS ABS: 0.1 10*3/uL (ref 0.0–0.5)
EOS%: 2.5 % (ref 0.0–7.0)
HEMATOCRIT: 28 % — AB (ref 34.8–46.6)
HEMOGLOBIN: 8.7 g/dL — AB (ref 11.6–15.9)
LYMPH#: 2.4 10*3/uL (ref 0.9–3.3)
LYMPH%: 50.1 % — ABNORMAL HIGH (ref 14.0–49.7)
MCH: 23.7 pg — AB (ref 25.1–34.0)
MCHC: 31.1 g/dL — ABNORMAL LOW (ref 31.5–36.0)
MCV: 76.3 fL — AB (ref 79.5–101.0)
MONO#: 0.3 10*3/uL (ref 0.1–0.9)
MONO%: 7 % (ref 0.0–14.0)
NEUT#: 1.9 10*3/uL (ref 1.5–6.5)
NEUT%: 39.8 % (ref 38.4–76.8)
PLATELETS: 298 10*3/uL (ref 145–400)
RBC: 3.67 10*6/uL — ABNORMAL LOW (ref 3.70–5.45)
RDW: 19.4 % — AB (ref 11.2–14.5)
WBC: 4.9 10*3/uL (ref 3.9–10.3)

## 2016-01-25 LAB — IRON AND TIBC
%SAT: 5 % — AB (ref 21–57)
IRON: 24 ug/dL — AB (ref 41–142)
TIBC: 443 ug/dL (ref 236–444)
UIBC: 419 ug/dL — ABNORMAL HIGH (ref 120–384)

## 2016-01-25 LAB — FERRITIN: Ferritin: 5 ng/ml — ABNORMAL LOW (ref 9–269)

## 2016-01-26 NOTE — Assessment & Plan Note (Deleted)
Chronic severe iron deficiency anemia secondary to heavy menstrual bleeding as well as recent tubal pregnancy related heavy bleeding. Patient has known sickle cell trait.  Lab Review: 01/25/16:  Hb 8.7 (imp from 5.8) MCV 76.3 (was 66.8) %sat: 5% TIBC: 419 Ferritin: 5  Based on her prior history, patient can only tolerate IVs iron sucrose. We will order iron sucrose 200 mg doses 5 days.

## 2016-01-27 ENCOUNTER — Ambulatory Visit: Payer: Medicaid Other

## 2016-01-27 ENCOUNTER — Telehealth: Payer: Self-pay | Admitting: Medical Oncology

## 2016-01-27 ENCOUNTER — Ambulatory Visit: Payer: Medicaid Other | Admitting: Hematology and Oncology

## 2016-01-27 LAB — HEMOGLOBINOPATHY EVALUATION
HGB C: 0 %
HGB S: 30.1 % — ABNORMAL HIGH
Hemoglobin A2 Quantitation: 3.7 % — ABNORMAL HIGH (ref 0.7–3.1)
Hemoglobin F Quantitation: 0 % (ref 0.0–2.0)
Hgb A: 66.2 % — ABNORMAL LOW (ref 94.0–98.0)
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
## 2016-01-27 NOTE — Telephone Encounter (Signed)
Spoke to pt thank you

## 2016-01-27 NOTE — Progress Notes (Deleted)
No care team member to display  DIAGNOSIS:  Encounter Diagnosis  Name Primary?  . Iron deficiency anemia due to chronic blood loss     SUMMARY OF ONCOLOGIC HISTORY: IV iron sucrose given 3/5 days September 2017  CHIEF COMPLIANT: Follow-up of iron deficiency anemia  INTERVAL HISTORY: Robyn Contreras is a 35 year old with blood loss related recurrent iron deficiency anemia. She only received 3 doses of IV iron in September. She did not come for the other appointments because of the inconvenience of coming down for the treatment as well as some issues regarding appointment time and place. She had previous heavy menstrual bleeding which was causing her anemia. Her hemoglobin was previously in the 5 g range. She has improved with IV iron infusion. Her symptoms have also improved. She is still extremely anemic and iron deficient still.  REVIEW OF SYSTEMS:   Constitutional: Denies fevers, chills or abnormal weight loss Eyes: Denies blurriness of vision Ears, nose, mouth, throat, and face: Denies mucositis or sore throat Respiratory: Denies cough, dyspnea or wheezes Cardiovascular: Denies palpitation, chest discomfort Gastrointestinal:  Denies nausea, heartburn or change in bowel habits Skin: Denies abnormal skin rashes Lymphatics: Denies new lymphadenopathy or easy bruising Neurological:Denies numbness, tingling or new weaknesses Behavioral/Psych: Mood is stable, no new changes  Extremities: No lower extremity edema  All other systems were reviewed with the patient and are negative.  I have reviewed the past medical history, past surgical history, social history and family history with the patient and they are unchanged from previous note.  ALLERGIES:  is allergic to vancomycin.  MEDICATIONS:  Current Outpatient Prescriptions  Medication Sig Dispense Refill  . clindamycin (CLEOCIN) 300 MG capsule Take 1 capsule (300 mg total) by mouth 4 (four) times daily. X 7 days 28 capsule 0  .  elvitegravir-cobicistat-emtricitabine-tenofovir (GENVOYA) 150-150-200-10 MG TABS tablet Take 1 tablet by mouth daily with breakfast. 90 tablet 3   No current facility-administered medications for this visit.     PHYSICAL EXAMINATION: ECOG PERFORMANCE STATUS: 1 - Symptomatic but completely ambulatory  There were no vitals filed for this visit. There were no vitals filed for this visit.  GENERAL:alert, no distress and comfortable SKIN: skin color, texture, turgor are normal, no rashes or significant lesions EYES: normal, Conjunctiva are pink and non-injected, sclera clear OROPHARYNX:no exudate, no erythema and lips, buccal mucosa, and tongue normal  NECK: supple, thyroid normal size, non-tender, without nodularity LYMPH:  no palpable lymphadenopathy in the cervical, axillary or inguinal LUNGS: clear to auscultation and percussion with normal breathing effort HEART: regular rate & rhythm and no murmurs and no lower extremity edema ABDOMEN:abdomen soft, non-tender and normal bowel sounds MUSCULOSKELETAL:no cyanosis of digits and no clubbing  NEURO: alert & oriented x 3 with fluent speech, no focal motor/sensory deficits EXTREMITIES: No lower extremity edema  LABORATORY DATA:  I have reviewed the data as listed   Chemistry      Component Value Date/Time   NA 139 09/21/2015 0953   K 3.6 09/21/2015 0953   CL 108 09/21/2015 0953   CO2 25 09/21/2015 0953   BUN 6 (L) 09/21/2015 0953   CREATININE 0.57 09/21/2015 0953      Component Value Date/Time   CALCIUM 8.7 09/21/2015 0953   ALKPHOS 73 09/21/2015 0953   AST 10 09/21/2015 0953   ALT 3 (L) 09/21/2015 0953   BILITOT 2.7 (H) 09/21/2015 0953       Lab Results  Component Value Date   WBC 4.9 01/25/2016  HGB 8.7 (L) 01/25/2016   HCT 28.0 (L) 01/25/2016   MCV 76.3 (L) 01/25/2016   PLT 298 01/25/2016   NEUTROABS 1.9 01/25/2016    ASSESSMENT & PLAN:  Iron deficiency anemia due to chronic blood loss Chronic severe iron  deficiency anemia secondary to heavy menstrual bleeding as well as recent tubal pregnancy related heavy bleeding. Patient has known sickle cell trait.  Lab Review: 01/25/16:  Hb 8.7 (imp from 5.8) MCV 76.3 (was 66.8) %sat: 5% TIBC: 419 Ferritin: 5  Based on her prior history, patient can only tolerate IVs iron sucrose. We will order iron sucrose 200 mg doses 5 days. Alternately she could receive 2 doses of Feraheme. But the patient tells me that she could only tolerate iron sucrose.   No orders of the defined types were placed in this encounter.  The patient has a good understanding of the overall plan. she agrees with it. she will call with any problems that may develop before the next visit here.   Sabas SousGudena, Rees Matura K, MD 01/27/16

## 2016-01-31 NOTE — Assessment & Plan Note (Deleted)
Chronic severe iron deficiency anemia secondary to heavy menstrual bleeding as well as recent tubal pregnancy related heavy bleeding. Patient has known sickle cell trait.  Lab Review: 01/25/16:  Hb 8.7 (imp from 5.8) MCV 76.3 (was 66.8) %sat: 5% TIBC: 419 Ferritin: 5  Based on her prior history, patient can only tolerate IVs iron sucrose. We will order iron sucrose 200 mg doses 5 days.  

## 2016-02-01 ENCOUNTER — Ambulatory Visit: Payer: Medicaid Other | Admitting: Hematology and Oncology

## 2016-02-01 ENCOUNTER — Ambulatory Visit: Payer: Medicaid Other

## 2016-02-03 ENCOUNTER — Ambulatory Visit: Payer: Medicaid Other | Admitting: Hematology and Oncology

## 2016-02-12 ENCOUNTER — Telehealth: Payer: Self-pay | Admitting: *Deleted

## 2016-02-12 ENCOUNTER — Emergency Department (HOSPITAL_COMMUNITY)
Admission: EM | Admit: 2016-02-12 | Discharge: 2016-02-12 | Disposition: A | Discharge status: Home / Self Care | Payer: Medicaid Other | Attending: Emergency Medicine | Admitting: Emergency Medicine

## 2016-02-12 ENCOUNTER — Encounter (HOSPITAL_COMMUNITY): Payer: Self-pay | Admitting: Emergency Medicine

## 2016-02-12 ENCOUNTER — Telehealth: Payer: Self-pay | Admitting: Pharmacist

## 2016-02-12 DIAGNOSIS — R05 Cough: Secondary | ICD-10-CM | POA: Diagnosis present

## 2016-02-12 DIAGNOSIS — Z79899 Other long term (current) drug therapy: Secondary | ICD-10-CM | POA: Insufficient documentation

## 2016-02-12 DIAGNOSIS — B349 Viral infection, unspecified: Secondary | ICD-10-CM | POA: Insufficient documentation

## 2016-02-12 HISTORY — DX: Anemia, unspecified: D64.9

## 2016-02-12 HISTORY — DX: Cardiac murmur, unspecified: R01.1

## 2016-02-12 LAB — COMPREHENSIVE METABOLIC PANEL
ALBUMIN: 4 g/dL (ref 3.5–5.0)
ALK PHOS: 56 U/L (ref 38–126)
ALT: 10 U/L — AB (ref 14–54)
AST: 14 U/L — ABNORMAL LOW (ref 15–41)
Anion gap: 9 (ref 5–15)
BUN: 6 mg/dL (ref 6–20)
CALCIUM: 8.8 mg/dL — AB (ref 8.9–10.3)
CO2: 27 mmol/L (ref 22–32)
CREATININE: 0.65 mg/dL (ref 0.44–1.00)
Chloride: 105 mmol/L (ref 101–111)
GFR calc Af Amer: >60 mL/min (ref >60)
GFR calc non Af Amer: >60 mL/min (ref >60)
GLUCOSE: 91 mg/dL (ref 65–99)
Potassium: 3.4 mmol/L — ABNORMAL LOW (ref 3.5–5.1)
SODIUM: 141 mmol/L (ref 135–145)
Total Bilirubin: 0.5 mg/dL (ref 0.3–1.2)
Total Protein: 7.8 g/dL (ref 6.5–8.1)

## 2016-02-12 LAB — URINALYSIS, ROUTINE W REFLEX MICROSCOPIC
Bilirubin Urine: NEGATIVE
Glucose, UA: NEGATIVE mg/dL
KETONES UR: NEGATIVE mg/dL
Leukocytes, UA: NEGATIVE
Nitrite: NEGATIVE
PH: 5 (ref 5.0–8.0)
PROTEIN: NEGATIVE mg/dL
Specific Gravity, Urine: 1.009 (ref 1.005–1.030)

## 2016-02-12 LAB — CBC
HCT: 28.2 % — ABNORMAL LOW (ref 36.0–46.0)
HEMOGLOBIN: 9.1 g/dL — AB (ref 12.0–15.0)
MCH: 24.3 pg — AB (ref 26.0–34.0)
MCHC: 32.3 g/dL (ref 30.0–36.0)
MCV: 75.2 fL — ABNORMAL LOW (ref 78.0–100.0)
PLATELETS: 396 10*3/uL (ref 150–400)
RBC: 3.75 MIL/uL — ABNORMAL LOW (ref 3.87–5.11)
RDW: 17.2 % — ABNORMAL HIGH (ref 11.5–15.5)
WBC: 3.7 10*3/uL — ABNORMAL LOW (ref 4.0–10.5)

## 2016-02-12 LAB — I-STAT BETA HCG BLOOD, ED (MC, WL, AP ONLY)

## 2016-02-12 LAB — LIPASE, BLOOD: Lipase: 18 U/L (ref 11–51)

## 2016-02-12 MED ORDER — KETOROLAC TROMETHAMINE 30 MG/ML IJ SOLN
30.0000 mg | Freq: Once | INTRAMUSCULAR | Status: AC
  Administered 2016-02-12: 30 mg via INTRAVENOUS
  Filled 2016-02-12: qty 1

## 2016-02-12 MED ORDER — ONDANSETRON HCL 4 MG/2ML IJ SOLN
4.0000 mg | Freq: Once | INTRAMUSCULAR | Status: AC
  Administered 2016-02-12: 4 mg via INTRAVENOUS
  Filled 2016-02-12: qty 2

## 2016-02-12 MED ORDER — IBUPROFEN 800 MG PO TABS: 800.0000 mg | ORAL_TABLET | Freq: Three times a day (TID) | ORAL | 0 refills | Status: DC | PRN

## 2016-02-12 MED ORDER — ONDANSETRON 4 MG PO TBDP: ORAL_TABLET | ORAL | 0 refills | Status: DC

## 2016-02-12 MED ORDER — SODIUM CHLORIDE 0.9 % IV BOLUS (SEPSIS)
1000.0000 mL | Freq: Once | INTRAVENOUS | Status: AC
  Administered 2016-02-12: 1000 mL via INTRAVENOUS

## 2016-02-12 NOTE — Telephone Encounter (Signed)
Robyn Contreras called stating that her medication she was on was causing her "body to shut down". She states she has diarrhea, stomach aches, body aches, and intermittent fevers.  I asked her how long this has been going on and she said ~3 weeks. She has been on Genvoya since August. I explained to her that it was not due to her medications.  Instructed her to continue taking the Genvoya and not miss any doses.  I told her she could have the flu or another virus.  Instructed her to go to urgent care or the ED to get checked out.  She said she would.

## 2016-02-12 NOTE — ED Provider Notes (Signed)
[facility]-EMERGENCY DEPT Provider Note   CSN: [id number] Arrival date & time: 02/12/16  1024     History   Chief Complaint Chief Complaint  Patient presents with  . Fatigue  . Cough  . Diarrhea    HPI Robyn Contreras is a 35 y.o. female.  Patient complains of mild cough diarrhea myalgias   The history is provided by the patient. No language interpreter was used.  Cough  This is a new problem. The current episode started 2 days ago. The problem occurs constantly. The problem has not changed since onset.The cough is non-productive. There has been no fever. Pertinent negatives include no chest pain and no headaches. She has tried nothing for the symptoms. The treatment provided no relief. She is not a smoker.  Diarrhea   Associated symptoms include cough. Pertinent negatives include no abdominal pain and no headaches.    Past Medical History:  Diagnosis Date  . Anemia   . Blindness of left eye   . Heart murmur   . HIV (human immunodeficiency virus infection) [facility])     Patient Active Problem List   Diagnosis Date Noted  . Heavy menstrual bleeding 10/29/2015  . Iron deficiency anemia due to chronic blood loss 10/20/2015  . HIV disease (HCC) 10/02/2015  . Sickle cell trait (HCC) 10/02/2015  . Heart murmur 10/02/2015    History reviewed. No pertinent surgical history.  OB History    No data available       Home Medications    Prior to Admission medications   Medication Sig Start Date End Date Taking? Authorizing Provider  COMBIGAN 0.2-0.5 % ophthalmic solution Place 1 drop into the right eye 3 (three) times daily. 11/25/15  Yes Historical Provider, MD  dorzolamide (TRUSOPT) 2 % ophthalmic solution Place 1 drop into the right eye 3 (three) times daily. 11/24/15  Yes Historical Provider, MD  elvitegravir-cobicistat-emtricitabine-tenofovir (GENVOYA) 150-150-200-10 MG TABS tablet Take 1 tablet by mouth daily with breakfast. 10/02/15  Yes Ginnie Smart, MD  pilocarpine  (PILOCAR) 1 % ophthalmic solution Place 1 drop into the right eye 3 (three) times daily. 11/25/15  Yes Historical Provider, MD  clindamycin (CLEOCIN) 300 MG capsule Take 1 capsule (300 mg total) by mouth 4 (four) times daily. X 7 days Patient not taking: Reported on 02/12/2016 11/26/15   Mancel Bale, MD  ibuprofen (ADVIL,MOTRIN) 800 MG tablet Take 1 tablet (800 mg total) by mouth every 8 (eight) hours as needed for moderate pain. 02/12/16   Bethann Berkshire, MD  ondansetron (ZOFRAN ODT) 4 MG disintegrating tablet 4mg  ODT q4 hours prn nausea/vomit 02/12/16   Bethann Berkshire, MD    Family History Family History  Problem Relation Age of Onset  . Aneurysm Father     Social History Social History  Substance Use Topics  . Smoking status: Never Smoker  . Smokeless tobacco: Never Used  . Alcohol use No     Allergies   Vancomycin   Review of Systems Review of Systems  Constitutional: Negative for appetite change and fatigue.  HENT: Negative for congestion, ear discharge and sinus pressure.   Eyes: Negative for discharge.  Respiratory: Positive for cough.   Cardiovascular: Negative for chest pain.  Gastrointestinal: Positive for diarrhea. Negative for abdominal pain.  Genitourinary: Negative for frequency and hematuria.  Musculoskeletal: Negative for back pain.  Skin: Negative for rash.  Neurological: Negative for seizures and headaches.  Psychiatric/Behavioral: Negative for hallucinations.     Physical Exam Updated Vital Signs BP 115/81 (BP Location:  Right Arm)   Pulse 65   Temp 98.1 F (36.7 C) (Oral)   Resp 18   Ht 5\' 2"  (1.575 m)   Wt 172 lb (78 kg)   SpO2 100%   BMI 31.46 kg/m   Physical Exam  Constitutional: She is oriented to person, place, and time. She appears well-developed.  HENT:  Head: Normocephalic.  Eyes: Conjunctivae and EOM are normal. No scleral icterus.  Neck: Neck supple. No thyromegaly present.  Cardiovascular: Normal rate and regular rhythm.  Exam  reveals no gallop and no friction rub.   No murmur heard. Pulmonary/Chest: No stridor. She has no wheezes. She has no rales. She exhibits no tenderness.  Abdominal: She exhibits no distension. There is no tenderness. There is no rebound.  Musculoskeletal: Normal range of motion. She exhibits no edema.  Lymphadenopathy:    She has no cervical adenopathy.  Neurological: She is oriented to person, place, and time. She exhibits normal muscle tone. Coordination normal.  Skin: No rash noted. No erythema.  Psychiatric: She has a normal mood and affect. Her behavior is normal.     ED Treatments / Results  Labs (all labs ordered are listed, but only abnormal results are displayed) Labs Reviewed  COMPREHENSIVE METABOLIC PANEL - Abnormal; Notable for the following:       Result Value   Potassium 3.4 (*)    Calcium 8.8 (*)    AST 14 (*)    ALT 10 (*)    All other components within normal limits  CBC - Abnormal; Notable for the following:    WBC 3.7 (*)    RBC 3.75 (*)    Hemoglobin 9.1 (*)    HCT 28.2 (*)    MCV 75.2 (*)    MCH 24.3 (*)    RDW 17.2 (*)    All other components within normal limits  URINALYSIS, ROUTINE W REFLEX MICROSCOPIC - Abnormal; Notable for the following:    Hgb urine dipstick SMALL (*)    Bacteria, UA RARE (*)    Squamous Epithelial / LPF 0-5 (*)    All other components within normal limits  LIPASE, BLOOD  I-STAT BETA HCG BLOOD, ED ([facility], [facility], [facility] ONLY)    EKG  EKG Interpretation None       Radiology No results found.  Procedures Procedures (including critical care time)  Medications Ordered in ED Medications  sodium chloride 0.9 % bolus 1,000 mL (1,000 mLs Intravenous New Bag/Given 02/12/16 1229)  ondansetron (ZOFRAN) injection 4 mg (4 mg Intravenous Given 02/12/16 1229)  ketorolac (TORADOL) 30 MG/ML injection 30 mg (30 mg Intravenous Given 02/12/16 1229)     Initial Impression / Assessment and Plan / ED Course  I have reviewed the triage vital  signs and the nursing notes.  Pertinent labs & imaging results that were available during my care of the patient were reviewed by me and considered in my medical decision making (see chart for details).  Clinical Course    Viral syndrome.  tx with motrin and zofran.  Pt will drink plenty of fluids and follow up as needed with her md  Final Clinical Impressions(s) / ED Diagnoses   Final diagnoses:  Viral syndrome    New Prescriptions New Prescriptions   IBUPROFEN (ADVIL,MOTRIN) 800 MG TABLET    Take 1 tablet (800 mg total) by mouth every 8 (eight) hours as needed for moderate pain.   ONDANSETRON (ZOFRAN ODT) 4 MG DISINTEGRATING TABLET    4mg  ODT q4 hours prn  nausea/vomit     Bethann BerkshireJoseph Sansa Alkema, MD 02/12/16 1524

## 2016-02-12 NOTE — ED Triage Notes (Addendum)
Per EMS. Pt reports weakness and feeling sick since last night. Has had a cough and diarrhea. No n/v. Hx of anemia and HIV. Has been taking her home medications.

## 2016-02-12 NOTE — Telephone Encounter (Signed)
Patient called c/o not feeling well since starting Genvoya. She has been on this since August when she last saw Dr. Ninetta LightsHatcher. She stated she was going to go to the ED. [facility] to discuss possible side effects. Wendall MolaJacqueline Cockerham

## 2016-02-12 NOTE — Discharge Instructions (Signed)
Drink plenty fluids. Follow up if not improving

## 2016-03-02 ENCOUNTER — Emergency Department (HOSPITAL_COMMUNITY)
Admission: EM | Admit: 2016-03-02 | Discharge: 2016-03-03 | Disposition: A | Discharge status: Home / Self Care | Payer: Medicaid Other | Attending: Emergency Medicine | Admitting: Emergency Medicine

## 2016-03-02 DIAGNOSIS — Z79899 Other long term (current) drug therapy: Secondary | ICD-10-CM | POA: Insufficient documentation

## 2016-03-02 DIAGNOSIS — R21 Rash and other nonspecific skin eruption: Secondary | ICD-10-CM | POA: Diagnosis not present

## 2016-03-02 DIAGNOSIS — T7840XA Allergy, unspecified, initial encounter: Secondary | ICD-10-CM

## 2016-03-02 DIAGNOSIS — T43595A Adverse effect of other antipsychotics and neuroleptics, initial encounter: Secondary | ICD-10-CM | POA: Insufficient documentation

## 2016-03-02 LAB — CBC
HEMATOCRIT: 26 % — AB (ref 36.0–46.0)
Hemoglobin: 8.2 g/dL — ABNORMAL LOW (ref 12.0–15.0)
MCH: 23.1 pg — ABNORMAL LOW (ref 26.0–34.0)
MCHC: 31.5 g/dL (ref 30.0–36.0)
MCV: 73.2 fL — ABNORMAL LOW (ref 78.0–100.0)
Platelets: 467 10*3/uL — ABNORMAL HIGH (ref 150–400)
RBC: 3.55 MIL/uL — ABNORMAL LOW (ref 3.87–5.11)
RDW: 16.5 % — AB (ref 11.5–15.5)
WBC: 6.5 10*3/uL (ref 4.0–10.5)

## 2016-03-02 NOTE — ED Triage Notes (Signed)
[facility], she is suicidal with a plan to cut her wrists, [facility], they gave her hydroxyzine and she started to have an allergic reaction to the med and then they gave her benedryl.  Pt states that she still feels itchy and thinks she has a slight rash on her back, no swelling and breathing is normal.

## 2016-03-03 LAB — COMPREHENSIVE METABOLIC PANEL
ALT: 8 U/L — ABNORMAL LOW (ref 14–54)
ANION GAP: 6 (ref 5–15)
AST: 15 U/L (ref 15–41)
Albumin: 4 g/dL (ref 3.5–5.0)
Alkaline Phosphatase: 55 U/L (ref 38–126)
BILIRUBIN TOTAL: 0.7 mg/dL (ref 0.3–1.2)
BUN: 6 mg/dL (ref 6–20)
CO2: 26 mmol/L (ref 22–32)
Calcium: 9.2 mg/dL (ref 8.9–10.3)
Chloride: 108 mmol/L (ref 101–111)
Creatinine, Ser: 0.64 mg/dL (ref 0.44–1.00)
GFR calc Af Amer: >60 mL/min (ref >60)
Glucose, Bld: 95 mg/dL (ref 65–99)
POTASSIUM: 3.2 mmol/L — AB (ref 3.5–5.1)
Sodium: 140 mmol/L (ref 135–145)
Total Protein: 7.7 g/dL (ref 6.5–8.1)

## 2016-03-03 LAB — ETHANOL

## 2016-03-03 LAB — SALICYLATE LEVEL: Salicylate Lvl: <7 mg/dL (ref 2.8–30.0)

## 2016-03-03 LAB — ACETAMINOPHEN LEVEL

## 2016-03-03 MED ORDER — POTASSIUM CHLORIDE CRYS ER 20 MEQ PO TBCR
20.0000 meq | EXTENDED_RELEASE_TABLET | Freq: Once | ORAL | Status: AC
  Administered 2016-03-03: 20 meq via ORAL
  Filled 2016-03-03: qty 1

## 2016-03-03 NOTE — ED Notes (Signed)
Called [facility] to verify and pt just needs to be cleared for her allergic reaction and [facility] can [facility]

## 2016-03-03 NOTE — ED Provider Notes (Signed)
[facility]-EMERGENCY DEPT Provider Note   CSN: [id number] Arrival date & time: 03/02/16  2257  By signing my name below, I, Nelwyn SalisburyJoshua Fowler, attest that this documentation has been prepared under the direction and in the presence of Tilden FossaElizabeth Saya Mccoll, MD . Electronically Signed: Nelwyn SalisburyJoshua Fowler, Scribe. 03/03/2016. 12:17 AM.  History   Chief Complaint Chief Complaint  Patient presents with  . Suicidal   The history is provided by the patient. No language interpreter was used.    HPI Comments:  Robyn Contreras is a 36 y.o. female with pmhx of HIV and Anemia who [facility] complaining of gradual-onset, gradually-resolving, mild itchiness and rash beginning earlier today. Pt states that she was started on hydroxyzine and her symptoms began after taking her first pill. She was given a benadryl here in the ED with moderate relief. Pt denies any SOB, cough or vomiting.   Past Medical History:  Diagnosis Date  . Anemia   . Blindness of left eye   . Heart murmur   . HIV (human immunodeficiency virus infection) [facility](HCC)     Patient Active Problem List   Diagnosis Date Noted  . Heavy menstrual bleeding 10/29/2015  . Iron deficiency anemia due to chronic blood loss 10/20/2015  . HIV disease (HCC) 10/02/2015  . Sickle cell trait (HCC) 10/02/2015  . Heart murmur 10/02/2015    No past surgical history on file.  OB History    No data available       Home Medications    Prior to Admission medications   Medication Sig Start Date End Date Taking? Authorizing Provider  elvitegravir-cobicistat-emtricitabine-tenofovir (GENVOYA) 150-150-200-10 MG TABS tablet Take 1 tablet by mouth daily with breakfast. 10/02/15  Yes Ginnie SmartJeffrey C Hatcher, MD  clindamycin (CLEOCIN) 300 MG capsule Take 1 capsule (300 mg total) by mouth 4 (four) times daily. X 7 days Patient not taking: Reported on 02/12/2016 11/26/15   Mancel BaleElliott Wentz, MD  COMBIGAN 0.2-0.5 % ophthalmic solution Place 1 drop  into the right eye 3 (three) times daily. 11/25/15   Historical Provider, MD  dorzolamide (TRUSOPT) 2 % ophthalmic solution Place 1 drop into the right eye 3 (three) times daily. 11/24/15   Historical Provider, MD  ibuprofen (ADVIL,MOTRIN) 800 MG tablet Take 1 tablet (800 mg total) by mouth every 8 (eight) hours as needed for moderate pain. Patient not taking: Reported on 03/02/2016 02/12/16   Bethann BerkshireJoseph Zammit, MD  ondansetron (ZOFRAN ODT) 4 MG disintegrating tablet 4mg  ODT q4 hours prn nausea/vomit Patient not taking: Reported on 03/02/2016 02/12/16   Bethann BerkshireJoseph Zammit, MD  pilocarpine (PILOCAR) 1 % ophthalmic solution Place 1 drop into the right eye 3 (three) times daily. 11/25/15   Historical Provider, MD    Family History Family History  Problem Relation Age of Onset  . Aneurysm Father     Social History Social History  Substance Use Topics  . Smoking status: Never Smoker  . Smokeless tobacco: Never Used  . Alcohol use No     Allergies   Vancomycin   Review of Systems Review of Systems  Respiratory: Negative for cough and shortness of breath.   Gastrointestinal: Negative for vomiting.  Skin: Positive for rash.       Positive for Itchiness  All other systems reviewed and are negative.    Physical Exam Updated Vital Signs BP 112/83 (BP Location: Left Arm)   Pulse 62   Temp 98.3 F (36.8 C) (Oral)   Resp 18   SpO2 99%  Physical Exam  Constitutional: She is oriented to person, place, and time. She appears well-developed and well-nourished.  HENT:  Head: Normocephalic and atraumatic.  Cardiovascular: Normal rate and regular rhythm.   Murmur heard. Faint systolic ejection murmur.   Pulmonary/Chest: Effort normal and breath sounds normal. No respiratory distress.  Abdominal: Soft. There is no tenderness. There is no rebound and no guarding.  Musculoskeletal: She exhibits no edema or tenderness.  Neurological: She is alert and oriented to person, place, and time.  Skin:  Skin is warm and dry.  Psychiatric: She has a normal mood and affect. Her behavior is normal.  Nursing note and vitals reviewed.    ED Treatments / Results  DIAGNOSTIC STUDIES:  Oxygen Saturation is 99% on RA, normal by my interpretation.    COORDINATION OF CARE:  12:18 AM Discussed treatment plan with pt at bedside which includes potassium replacement and discharge back to [facility] and pt agreed to plan.  Labs (all labs ordered are listed, but only abnormal results are displayed) Labs Reviewed  COMPREHENSIVE METABOLIC PANEL - Abnormal; Notable for the following:       Result Value   Potassium 3.2 (*)    ALT 8 (*)    All other components within normal limits  ACETAMINOPHEN LEVEL - Abnormal; Notable for the following:    Acetaminophen (Tylenol), Serum <10 (*)    All other components within normal limits  CBC - Abnormal; Notable for the following:    RBC 3.55 (*)    Hemoglobin 8.2 (*)    HCT 26.0 (*)    MCV 73.2 (*)    MCH 23.1 (*)    RDW 16.5 (*)    Platelets 467 (*)    All other components within normal limits  ETHANOL  SALICYLATE LEVEL  RAPID URINE DRUG SCREEN, HOSP PERFORMED    EKG  EKG Interpretation None       Radiology No results found.  Procedures Procedures (including critical care time)  Medications Ordered in ED Medications  potassium chloride SA (K-DUR,KLOR-CON) CR tablet 20 mEq (20 mEq Oral Given 03/03/16 0022)     Initial Impression / Assessment and Plan / ED Course  I have reviewed the triage vital signs and the nursing notes.  Pertinent labs & imaging results that were available during my care of the patient were reviewed by me and considered in my medical decision making (see chart for details).  Clinical Course     Patient referred from [facility] for itching after taking hydroxyzine. She does have some mild itching but no apparent rash on examination. Presentation is not consistent with anaphylaxis or life-threatening allergic reaction.  She was pretreated with Benadryl prior to evaluation in the emergency department. Plan to DC back to [facility] with recommendation for her discontinuing Atarax/hydroxyzein. Outpatient follow-up and return precautions discussed. CBC with stable anemia. She does have mild hypokalemia on her CMP, will replace.  Final Clinical Impressions(s) / ED Diagnoses   Final diagnoses:  Allergic reaction to drug, initial encounter    New Prescriptions Discharge Medication List as of 03/03/2016 12:21 AM    I personally performed the services described in this documentation, which was scribed in my presence. The recorded information has been reviewed and is accurate.     Tilden Fossa, MD 03/03/16 [phone]

## 2016-03-03 NOTE — Discharge Instructions (Signed)
Present back [facility].  Do not take hydroxyzine (atarax) in the future.

## 2016-03-28 ENCOUNTER — Ambulatory Visit: Payer: Medicaid Other | Admitting: Infectious Diseases

## 2016-05-09 ENCOUNTER — Telehealth: Payer: Self-pay | Admitting: Hematology and Oncology

## 2016-05-09 NOTE — Telephone Encounter (Signed)
Needs to reschedule missed infusion and dr appointment

## 2016-05-10 ENCOUNTER — Telehealth: Payer: Self-pay | Admitting: Hematology and Oncology

## 2016-05-10 NOTE — Telephone Encounter (Signed)
Routed to desk nurse, per Ashley/operator. 05/10/16

## 2016-05-11 ENCOUNTER — Other Ambulatory Visit: Payer: Self-pay

## 2016-05-11 ENCOUNTER — Telehealth: Payer: Self-pay

## 2016-05-11 DIAGNOSIS — D500 Iron deficiency anemia secondary to blood loss (chronic): Secondary | ICD-10-CM

## 2016-05-11 DIAGNOSIS — N920 Excessive and frequent menstruation with regular cycle: Secondary | ICD-10-CM

## 2016-05-11 NOTE — Telephone Encounter (Signed)
Pt called to reschedule her appt with Dr.Gudena for labs/md/infusion for iron. Pt states that she had missed so many appts previously because she had a lot going on in her life. She is now having some heavy bleeding, and cramping. Pt states that she needs to get her iron levels checked to see if she needs infusion. Told pt that she will need to have iron levels checked first and will schedule appt with Dr.Gudena. His schedule is full next week and will be out of the office in the next few weeks. Will notify Dr.Gudena of pt iron studies result and see when he would like to see pt. Pt verbalized understanding and confirmed lab appt tomorrow at 3:15 (3/22). Pt is not currently bleeding at this time.

## 2016-05-12 ENCOUNTER — Emergency Department (HOSPITAL_COMMUNITY): Payer: Medicaid Other

## 2016-05-12 ENCOUNTER — Encounter (HOSPITAL_COMMUNITY): Payer: Self-pay

## 2016-05-12 ENCOUNTER — Emergency Department (HOSPITAL_COMMUNITY)
Admission: EM | Admit: 2016-05-12 | Discharge: 2016-05-12 | Disposition: A | Discharge status: Home / Self Care | Payer: Medicaid Other | Attending: Emergency Medicine | Admitting: Emergency Medicine

## 2016-05-12 ENCOUNTER — Other Ambulatory Visit: Payer: Medicaid Other

## 2016-05-12 DIAGNOSIS — D649 Anemia, unspecified: Secondary | ICD-10-CM | POA: Insufficient documentation

## 2016-05-12 DIAGNOSIS — N3000 Acute cystitis without hematuria: Secondary | ICD-10-CM

## 2016-05-12 DIAGNOSIS — R079 Chest pain, unspecified: Secondary | ICD-10-CM | POA: Diagnosis present

## 2016-05-12 DIAGNOSIS — R0789 Other chest pain: Secondary | ICD-10-CM | POA: Diagnosis not present

## 2016-05-12 HISTORY — DX: Heart failure, unspecified: I50.9

## 2016-05-12 LAB — COMPREHENSIVE METABOLIC PANEL
ALBUMIN: 3.6 g/dL (ref 3.5–5.0)
ALT: 11 U/L — ABNORMAL LOW (ref 14–54)
ANION GAP: 9 (ref 5–15)
AST: 17 U/L (ref 15–41)
Alkaline Phosphatase: 64 U/L (ref 38–126)
BUN: 5 mg/dL — ABNORMAL LOW (ref 6–20)
CHLORIDE: 107 mmol/L (ref 101–111)
CO2: 22 mmol/L (ref 22–32)
Calcium: 9.3 mg/dL (ref 8.9–10.3)
Creatinine, Ser: 0.57 mg/dL (ref 0.44–1.00)
GFR calc Af Amer: >60 mL/min (ref >60)
Glucose, Bld: 89 mg/dL (ref 65–99)
POTASSIUM: 3 mmol/L — AB (ref 3.5–5.1)
Sodium: 138 mmol/L (ref 135–145)
TOTAL PROTEIN: 7.9 g/dL (ref 6.5–8.1)
Total Bilirubin: 0.6 mg/dL (ref 0.3–1.2)

## 2016-05-12 LAB — URINALYSIS, ROUTINE W REFLEX MICROSCOPIC
Bilirubin Urine: NEGATIVE
Glucose, UA: NEGATIVE mg/dL
Hgb urine dipstick: NEGATIVE
Ketones, ur: NEGATIVE mg/dL
Leukocytes, UA: NEGATIVE
Nitrite: POSITIVE — AB
Protein, ur: NEGATIVE mg/dL
Specific Gravity, Urine: 1.011 (ref 1.005–1.030)
pH: 6 (ref 5.0–8.0)

## 2016-05-12 LAB — CBC
HEMATOCRIT: 27 % — AB (ref 36.0–46.0)
Hemoglobin: 8.2 g/dL — ABNORMAL LOW (ref 12.0–15.0)
MCH: 22 pg — ABNORMAL LOW (ref 26.0–34.0)
MCHC: 30.4 g/dL (ref 30.0–36.0)
MCV: 72.6 fL — AB (ref 78.0–100.0)
PLATELETS: 273 10*3/uL (ref 150–400)
RBC: 3.72 MIL/uL — ABNORMAL LOW (ref 3.87–5.11)
RDW: 17.4 % — AB (ref 11.5–15.5)
WBC: 6.5 10*3/uL (ref 4.0–10.5)

## 2016-05-12 LAB — D-DIMER, QUANTITATIVE: D-Dimer, Quant: 1.75 ug/mL-FEU — ABNORMAL HIGH (ref 0.00–0.50)

## 2016-05-12 LAB — PREGNANCY, URINE: Preg Test, Ur: NEGATIVE

## 2016-05-12 LAB — TROPONIN I: Troponin I: <0.03 ng/mL (ref <0.03)

## 2016-05-12 LAB — LIPASE, BLOOD: Lipase: 12 U/L (ref 11–51)

## 2016-05-12 MED ORDER — SULFAMETHOXAZOLE-TRIMETHOPRIM 800-160 MG PO TABS: 1.0000 | ORAL_TABLET | Freq: Two times a day (BID) | ORAL | 0 refills | Status: AC

## 2016-05-12 MED ORDER — SULFAMETHOXAZOLE-TRIMETHOPRIM 800-160 MG PO TABS
1.0000 | ORAL_TABLET | Freq: Once | ORAL | Status: AC
  Administered 2016-05-12: 1 via ORAL
  Filled 2016-05-12: qty 1

## 2016-05-12 MED ORDER — KETOROLAC TROMETHAMINE 10 MG PO TABS: 10.0000 mg | ORAL_TABLET | Freq: Four times a day (QID) | ORAL | 0 refills | Status: AC | PRN

## 2016-05-12 MED ORDER — IOPAMIDOL (ISOVUE-370) INJECTION 76%
INTRAVENOUS | Status: AC
  Administered 2016-05-12: 100 mL
  Filled 2016-05-12: qty 100

## 2016-05-12 NOTE — ED Provider Notes (Signed)
[facility]-EMERGENCY DEPT Provider Note   CSN: [id number] Arrival date & time: 05/12/16  1716     History   Chief Complaint Chief Complaint  Patient presents with  . Chest Pain    HPI Robyn Contreras is a 36 y.o. female.  Pt presents to the ED today with CP.  It has been going on for 2 days.  The pt denies other associated sx.  The pt was given asa and 1 nitro by EMS with no change in cp.      Past Medical History:  Diagnosis Date  . Anemia   . Blindness of left eye   . CHF (congestive heart failure) (HCC)   . Heart murmur   . HIV (human immunodeficiency virus infection) [facility](HCC)     Patient Active Problem List   Diagnosis Date Noted  . Heavy menstrual bleeding 10/29/2015  . Iron deficiency anemia due to chronic blood loss 10/20/2015  . HIV disease (HCC) 10/02/2015  . Sickle cell trait (HCC) 10/02/2015  . Heart murmur 10/02/2015    History reviewed. No pertinent surgical history.  OB History    No data available       Home Medications    Prior to Admission medications   Medication Sig Start Date End Date Taking? Authorizing Provider  COMBIGAN 0.2-0.5 % ophthalmic solution Place 1 drop into the right eye 3 (three) times daily. 11/25/15  Yes Historical Provider, MD  dorzolamide (TRUSOPT) 2 % ophthalmic solution Place 1 drop into the right eye 3 (three) times daily. 11/24/15  Yes Historical Provider, MD  elvitegravir-cobicistat-emtricitabine-tenofovir (GENVOYA) 150-150-200-10 MG TABS tablet Take 1 tablet by mouth daily with breakfast. 10/02/15  Yes Ginnie SmartJeffrey C Hatcher, MD  ketorolac (TORADOL) 10 MG tablet Take 1 tablet (10 mg total) by mouth every 6 (six) hours as needed. 05/12/16   Jacalyn LefevreJulie Shaila Gilchrest, MD  sulfamethoxazole-trimethoprim (BACTRIM DS,SEPTRA DS) 800-160 MG tablet Take 1 tablet by mouth 2 (two) times daily. 05/12/16 05/19/16  Jacalyn LefevreJulie Andoni Busch, MD    Family History Family History  Problem Relation Age of Onset  . Aneurysm Father     Social History Social History    Substance Use Topics  . Smoking status: Never Smoker  . Smokeless tobacco: Never Used  . Alcohol use No     Allergies   Vancomycin   Review of Systems Review of Systems  Cardiovascular: Positive for chest pain.  All other systems reviewed and are negative.    Physical Exam Updated Vital Signs BP 134/80   Pulse 71   Temp 99.6 F (37.6 C) (Oral)   Resp 20   Ht 5\' 2"  (1.575 m)   Wt 175 lb (79.4 kg)   LMP 04/22/2016   SpO2 100%   BMI 32.01 kg/m   Physical Exam  Constitutional: She is oriented to person, place, and time. She appears well-developed and well-nourished.  HENT:  Head: Atraumatic.  Right Ear: External ear normal.  Left Ear: External ear normal.  Nose: Nose normal.  Mouth/Throat: Oropharynx is clear and moist.  Eyes:  Left eye blind  Neck: Normal range of motion. Neck supple.  Cardiovascular: Normal rate, regular rhythm and intact distal pulses.   Murmur heard.  Systolic murmur is present  Pulmonary/Chest: Effort normal and breath sounds normal.  Abdominal: Soft. Bowel sounds are normal.  Musculoskeletal: Normal range of motion.  Neurological: She is alert and oriented to person, place, and time.  Skin: Skin is warm.  Psychiatric: She has a normal mood and affect. Her behavior is  normal. Judgment and thought content normal.  Nursing note and vitals reviewed.    ED Treatments / Results  Labs (all labs ordered are listed, but only abnormal results are displayed) Labs Reviewed  CBC - Abnormal; Notable for the following:       Result Value   RBC 3.72 (*)    Hemoglobin 8.2 (*)    HCT 27.0 (*)    MCV 72.6 (*)    MCH 22.0 (*)    RDW 17.4 (*)    All other components within normal limits  COMPREHENSIVE METABOLIC PANEL - Abnormal; Notable for the following:    Potassium 3.0 (*)    BUN 5 (*)    ALT 11 (*)    All other components within normal limits  URINALYSIS, ROUTINE W REFLEX MICROSCOPIC - Abnormal; Notable for the following:    APPearance  HAZY (*)    Nitrite POSITIVE (*)    Bacteria, UA MANY (*)    Squamous Epithelial / LPF 0-5 (*)    All other components within normal limits  D-DIMER, QUANTITATIVE (NOT AT [facility]) - Abnormal; Notable for the following:    D-Dimer, Quant 1.75 (*)    All other components within normal limits  TROPONIN I  LIPASE, BLOOD  PREGNANCY, URINE    EKG  EKG Interpretation  Date/Time:  Thursday May 12 2016 17:22:11 EDT Ventricular Rate:  81 PR Interval:    QRS Duration: 85 QT Interval:  357 QTC Calculation: 415 R Axis:   97 Text Interpretation:  Sinus rhythm Borderline right axis deviation No old tracing to compare Confirmed by [facility] MD, Bohdi Leeds [phone]) on 05/12/2016 5:59:29 PM       Radiology Dg Chest 2 View  Result Date: 05/12/2016 CLINICAL DATA:  Acute onset of mid and left-sided chest pain and shortness of breath. Initial encounter. EXAM: CHEST  2 VIEW COMPARISON:  None. FINDINGS: The lungs are well-aerated and clear. There is no evidence of focal opacification, pleural effusion or pneumothorax. The heart is normal in size; the mediastinal contour is within normal limits. No acute osseous abnormalities are seen. IMPRESSION: No acute cardiopulmonary process seen. Electronically Signed   By: Roanna Raider M.D.   On: 05/12/2016 18:29   Ct Angio Chest Pe W And/or Wo Contrast  Result Date: 05/12/2016 CLINICAL DATA:  Pt c/o severe CP x 2 days. Pt presents with elevated d-dimer. EXAM: CT ANGIOGRAPHY CHEST WITH CONTRAST TECHNIQUE: Multidetector CT imaging of the chest was performed using the standard protocol during bolus administration of intravenous contrast. Multiplanar CT image reconstructions and MIPs were obtained to evaluate the vascular anatomy. CONTRAST:  80 ml isovue 370 for first scan and 65 ml isovue 370 iv for second. Dr. Azucena Kuba recommended second scan with reduced dose. COMPARISON:  None. FINDINGS: Cardiovascular: Satisfactory opacification of pulmonary arteries noted, and there is no  evidence of pulmonary emboli. Adequate contrast opacification of the thoracic aorta with no evidence of dissection, aneurysm, or stenosis. There is classic 3-vessel brachiocephalic arch anatomy without proximal stenosis. Mediastinum/Nodes: No pericardial effusion. No adenopathy localized. Lungs/Pleura: Lungs are clear. No pleural effusion or pneumothorax. Upper Abdomen: 2.2 cm low-attenuation lesion in hepatic segment 5 ; there is a focus of contrast puddling peripherally suggesting benign hemangioma but the lesion is incompletely evaluated. No acute findings. Musculoskeletal: No chest wall abnormality. No acute or significant osseous findings. Review of the MIP images confirms the above findings. IMPRESSION: 1. Negative for acute PE or thoracic aortic dissection. 2. 2.2 cm right liver lesion, possibly  benign hemangioma but incompletely evaluated. When the patient is clinically stable and able to follow directions and hold their breath (preferably as an outpatient) further evaluation with dedicated abdominal MRI should be considered. Electronically Signed   By: Corlis Leak M.D.   On: 05/12/2016 19:48    Procedures Procedures (including critical care time)  Medications Ordered in ED Medications  iopamidol (ISOVUE-370) 76 % injection (100 mLs  Contrast Given 05/12/16 1845)     Initial Impression / Assessment and Plan / ED Course  I have reviewed the triage vital signs and the nursing notes.  Pertinent labs & imaging results that were available during my care of the patient were reviewed by me and considered in my medical decision making (see chart for details).    Pt is feeling better.  She has atypical cp.  No PE.  She has chronic anemia which is stable.  Pt to be started on bactrim for her uti.  She knows to return if worse.  Final Clinical Impressions(s) / ED Diagnoses   Final diagnoses:  Chest wall pain  Acute cystitis without hematuria  Chronic anemia    New Prescriptions New  Prescriptions   KETOROLAC (TORADOL) 10 MG TABLET    Take 1 tablet (10 mg total) by mouth every 6 (six) hours as needed.   SULFAMETHOXAZOLE-TRIMETHOPRIM (BACTRIM DS,SEPTRA DS) 800-160 MG TABLET    Take 1 tablet by mouth 2 (two) times daily.     Jacalyn Lefevre, MD 05/12/16 2016

## 2016-05-12 NOTE — ED Triage Notes (Signed)
Patient CO of CP X2 days described as a tightness all the way across with no associated symptoms. EMS gave 324 ASA and 1 nitro with no change in the pain

## 2016-09-09 ENCOUNTER — Telehealth: Payer: Self-pay | Admitting: *Deleted

## 2016-09-09 NOTE — Telephone Encounter (Signed)
[place]  [facility].

## 2018-08-29 IMAGING — DX DG CHEST 2V
2 series · 2 of 2 positions shown · non-contrast
Comparison: None.

CLINICAL DATA: Acute onset of mid and left-sided chest pain and
shortness of breath. Initial encounter.

EXAM:
CHEST  2 VIEW

[chest pa]
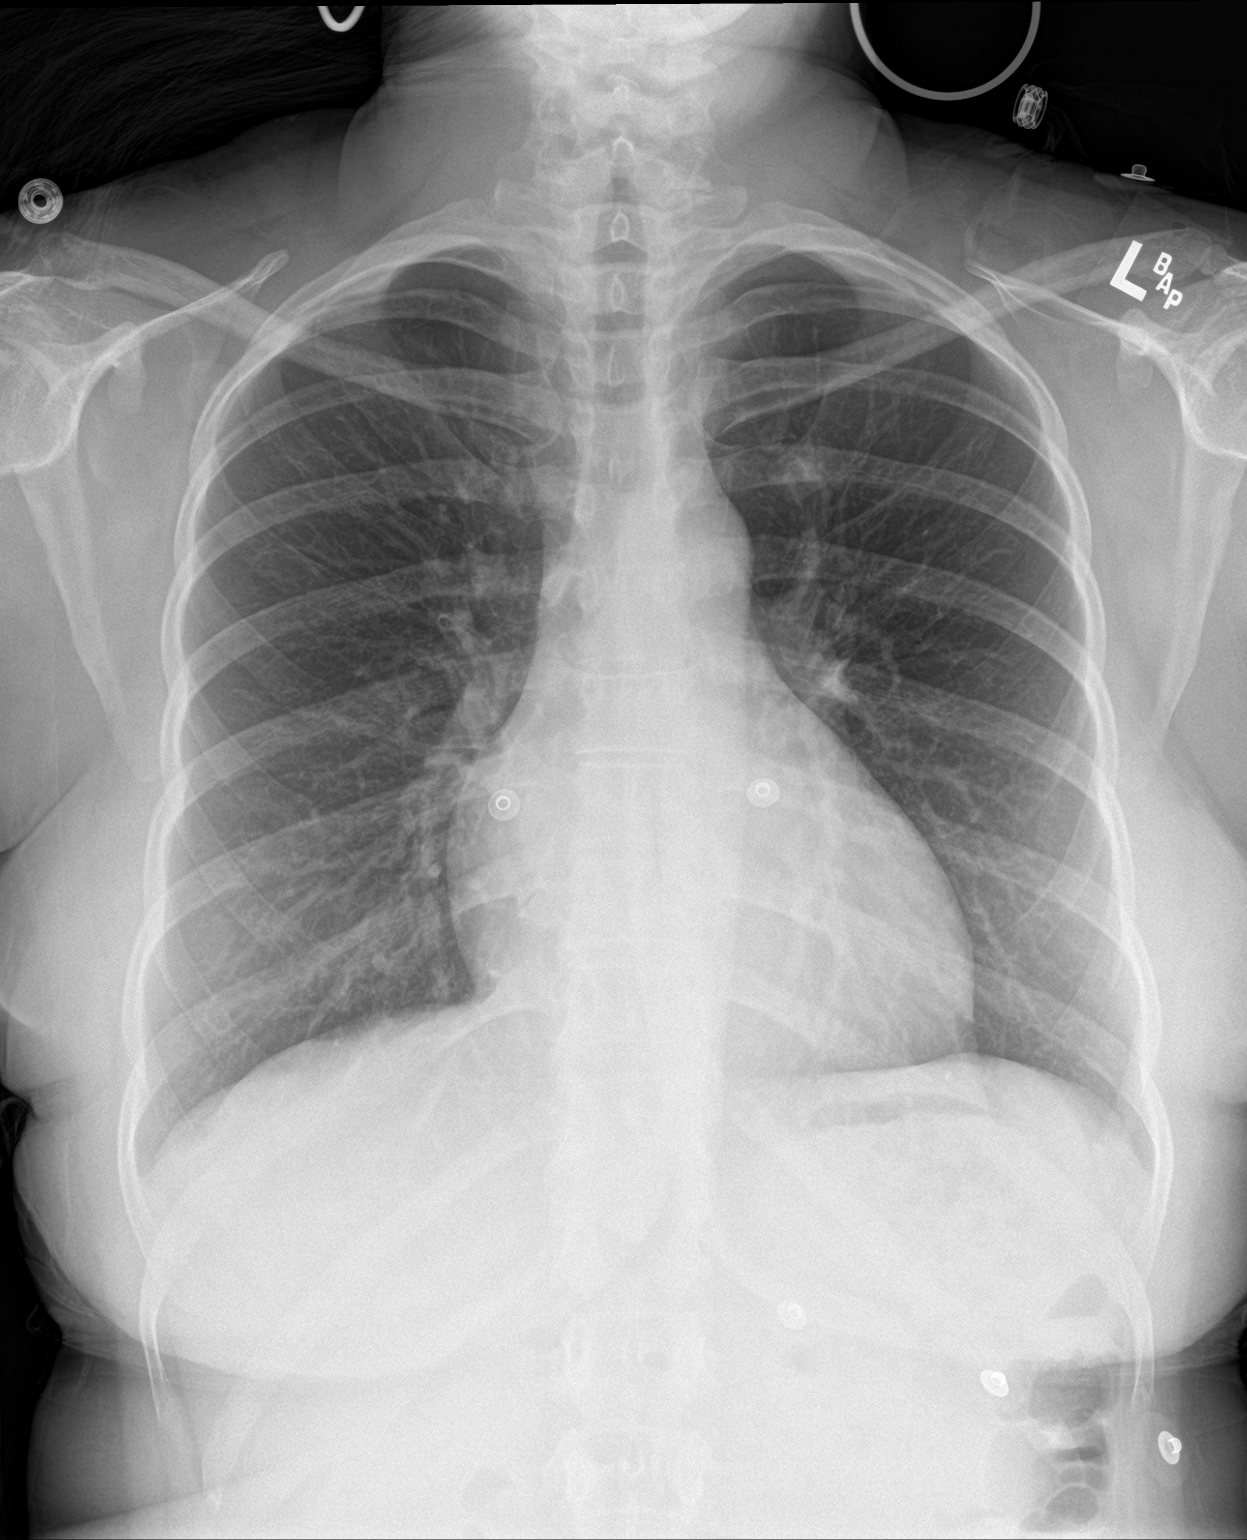

[chest lat]
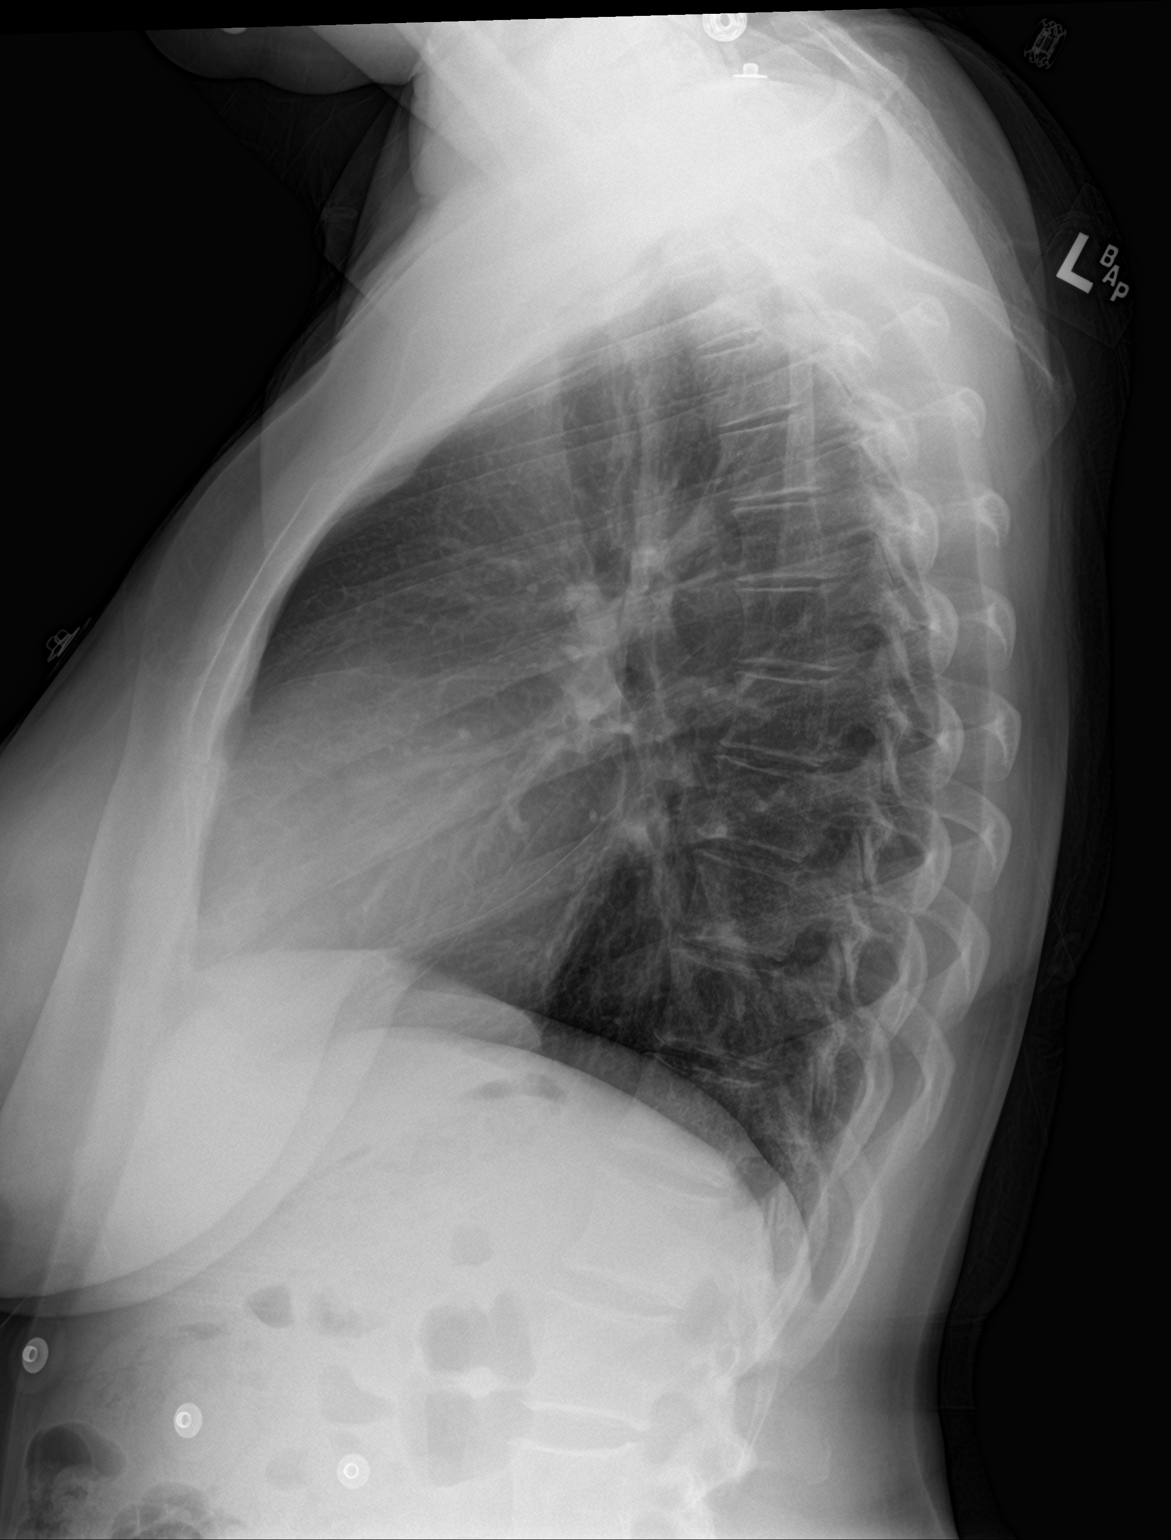

[2 of 2 positions shown; findings below may reference images not displayed]

FINDINGS: The lungs are well-aerated and clear. There is no evidence of focal
opacification, pleural effusion or pneumothorax.

The heart is normal in size; the mediastinal contour is within
normal limits. No acute osseous abnormalities are seen.
IMPRESSION: No acute cardiopulmonary process seen.

## 2018-08-29 IMAGING — CT CT ANGIO CHEST
3 of 15 series · 18 of 46 positions shown · IV contrast (isovue)
Comparison: None.

CLINICAL DATA: Pt c/o severe CP x 2 days. Pt presents with elevated
d-dimer.

EXAM:
CT ANGIOGRAPHY CHEST WITH CONTRAST
TECHNIQUE: Multidetector CT imaging of the chest was performed using the
standard protocol during bolus administration of intravenous
contrast. Multiplanar CT image reconstructions and MIPs were
obtained to evaluate the vascular anatomy.
CONTRAST:  80 ml isovue 370 for first scan and 65 ml isovue 370 iv
for second. Dr. Masic recommended second scan with reduced dose.

[Series 9: thins · axial · 0.57mm/px · z∈[+971,+1212]mm · 10 of 295 slices shown (1 of 2)]
[im 27/295  lung]
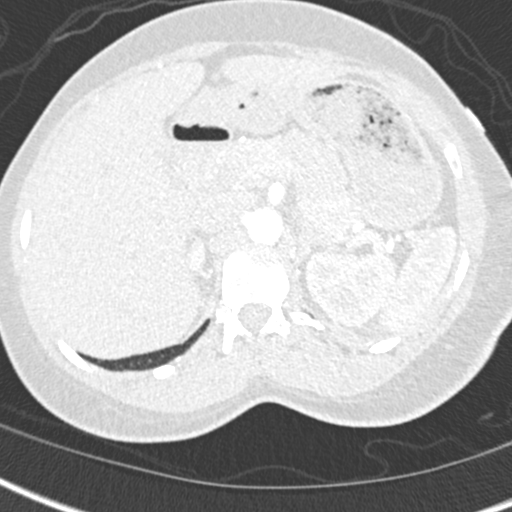
[im 54/295  soft-tissue]
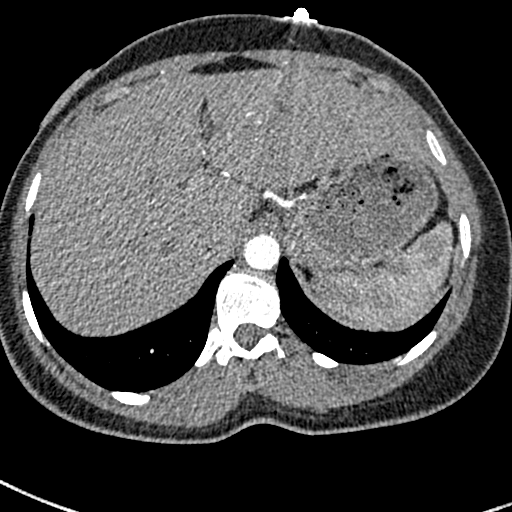
[im 81/295  lung]
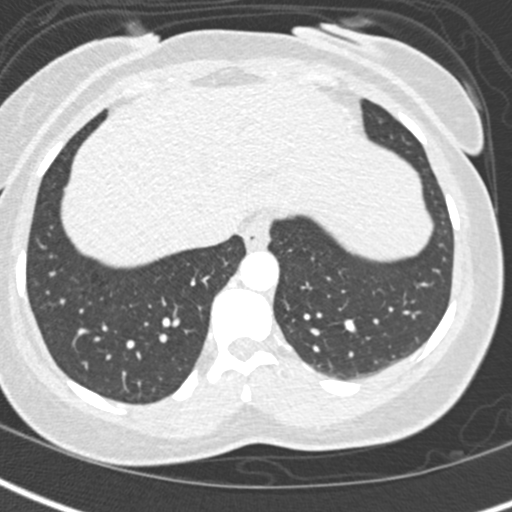
[im 107/295  soft-tissue]
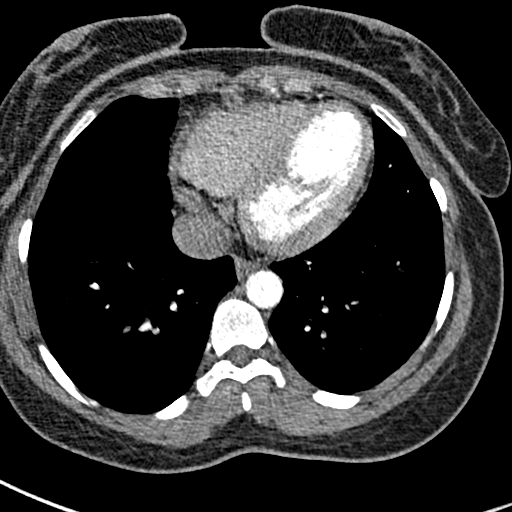
[im 134/295  lung]
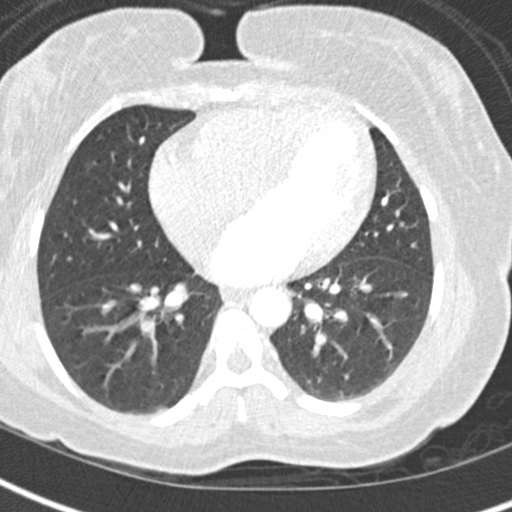
[im 161/295  soft-tissue]
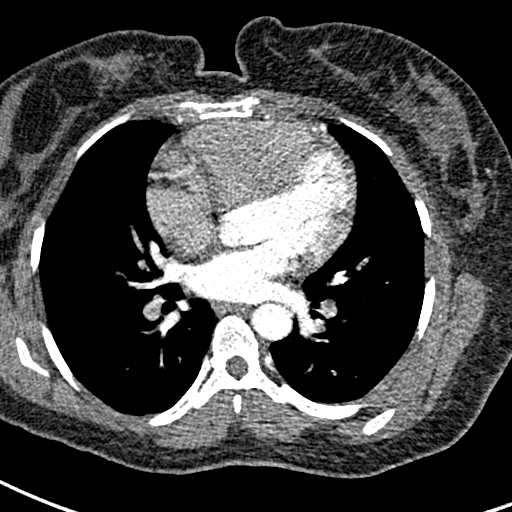
[im 188/295  lung]
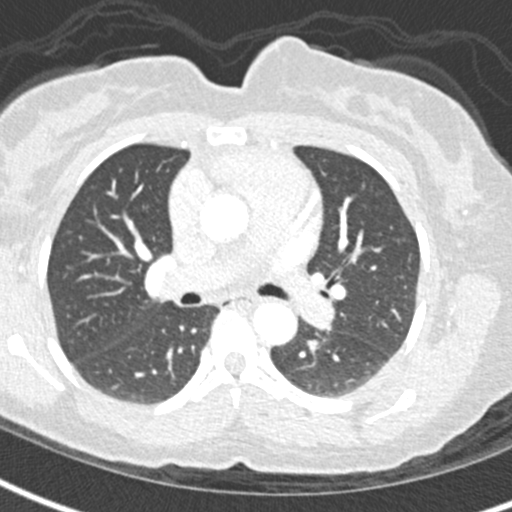
[im 214/295  soft-tissue]
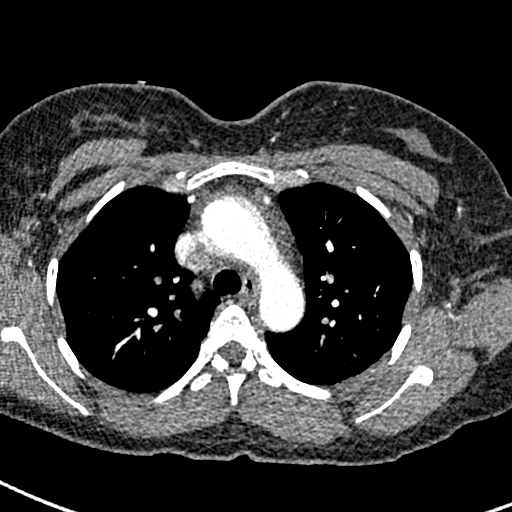
[im 241/295  lung]
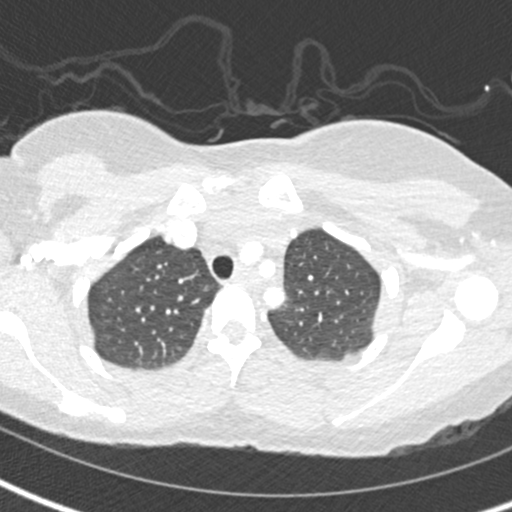
[im 268/295  soft-tissue]
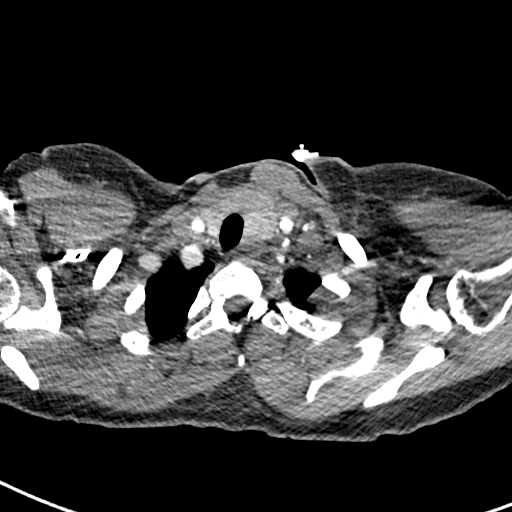

[Series 16: thins · axial · 0.56mm/px · z∈[+1149,+1265]mm · 5 of 233 slices shown (2 of 2)]
[im 30/233  lung]
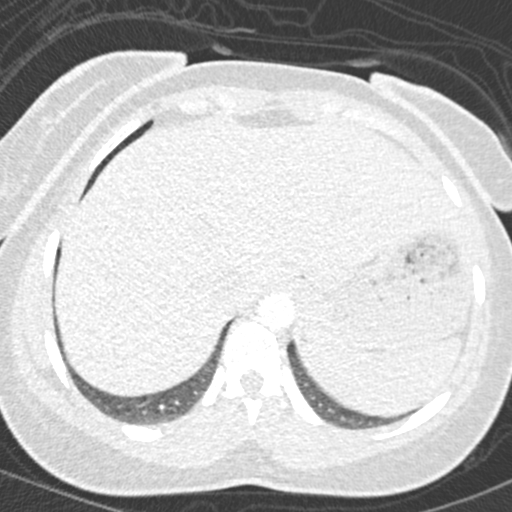
[im 59/233  lung]
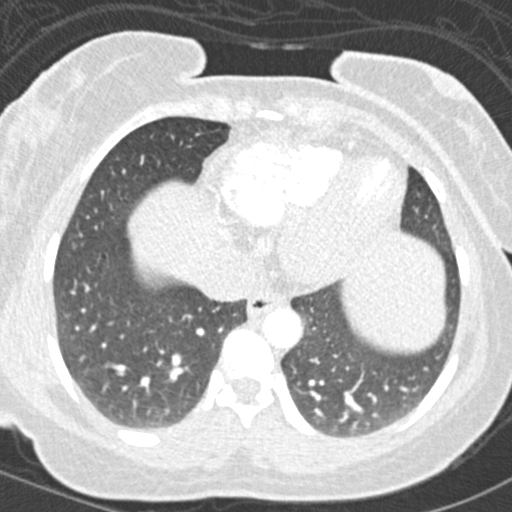
[im 88/233  lung]
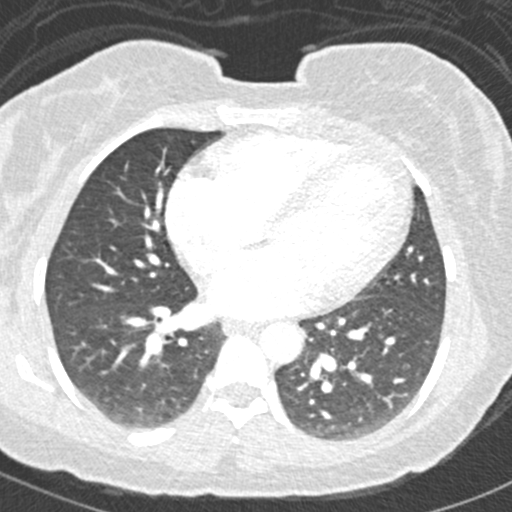
[im 117/233  lung]
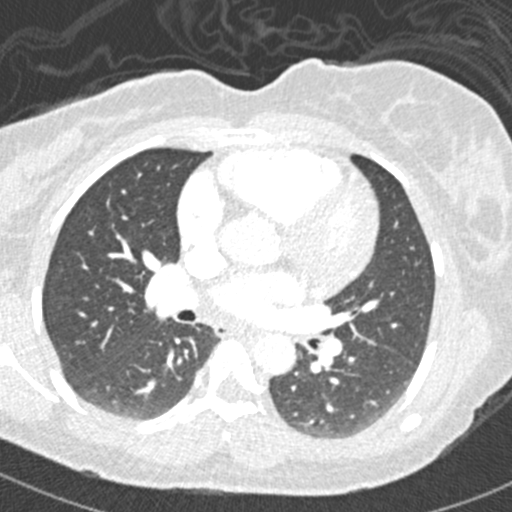
[im 146/233  lung]
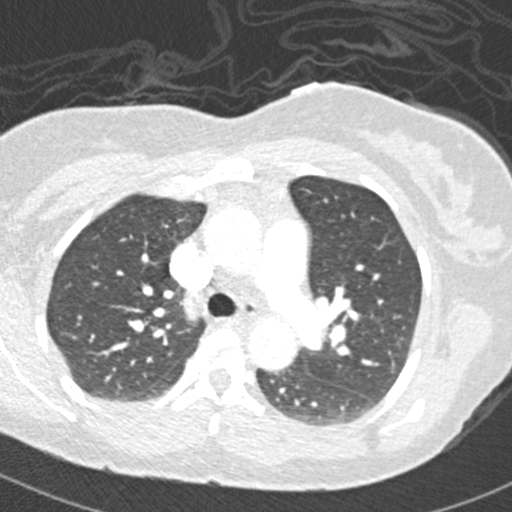

[Series 18: coronal mpr · coronal · 0.46mm/px · 3 of 114 slices shown]
[im 29/114  soft-tissue]
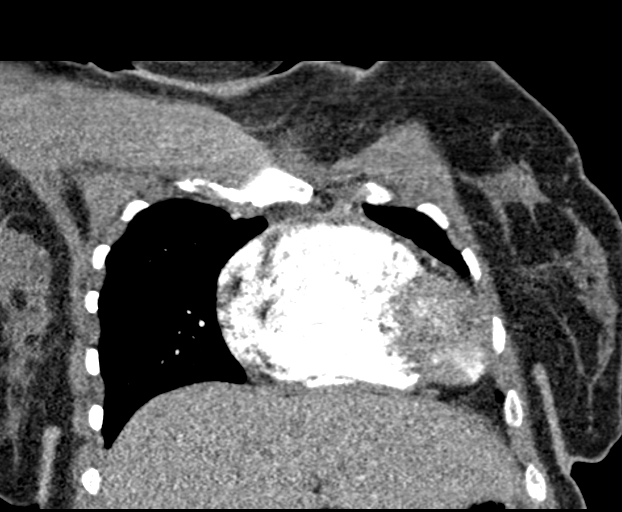
[im 57/114  soft-tissue]
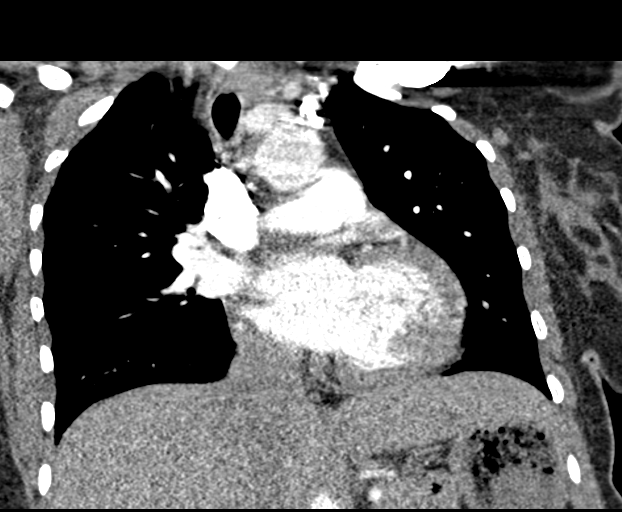
[im 85/114  soft-tissue]
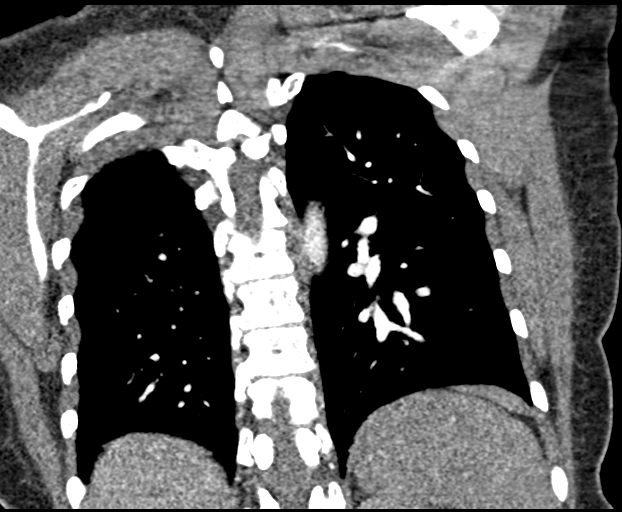

[18 of 46 positions shown; findings below may reference images not displayed]

FINDINGS: Cardiovascular: Satisfactory opacification of pulmonary arteries
noted, and there is no evidence of pulmonary emboli. Adequate
contrast opacification of the thoracic aorta with no evidence of
dissection, aneurysm, or stenosis. There is classic 3-vessel
brachiocephalic arch anatomy without proximal stenosis.

Mediastinum/Nodes: No pericardial effusion. No adenopathy localized.

Lungs/Pleura: Lungs are clear. No pleural effusion or pneumothorax.

Upper Abdomen: 2.2 cm low-attenuation lesion in hepatic segment 5 ;
there is a focus of contrast puddling peripherally suggesting benign
hemangioma but the lesion is incompletely evaluated. No acute
findings.

Musculoskeletal: No chest wall abnormality. No acute or significant
osseous findings.

Review of the MIP images confirms the above findings.
IMPRESSION: 1. Negative for acute PE or thoracic aortic dissection.
2. 2.2 cm right liver lesion, possibly benign hemangioma but
incompletely evaluated. When the patient is clinically stable and
able to follow directions and hold their breath (preferably as an
outpatient) further evaluation with dedicated abdominal MRI should
be considered.
